# Patient Record
Sex: Female | Born: 1945 | Race: Black or African American | Hispanic: No | Marital: Single | Smoking: Never smoker
Health system: Southern US, Community
[De-identification: ages and names within clinical notes are randomized; demographics above are authoritative.]

## PROBLEM LIST (undated history)

## (undated) DIAGNOSIS — M1990 Unspecified osteoarthritis, unspecified site: Secondary | ICD-10-CM

## (undated) DIAGNOSIS — I2510 Atherosclerotic heart disease of native coronary artery without angina pectoris: Secondary | ICD-10-CM

## (undated) DIAGNOSIS — J45909 Unspecified asthma, uncomplicated: Secondary | ICD-10-CM

## (undated) DIAGNOSIS — N200 Calculus of kidney: Secondary | ICD-10-CM

## (undated) DIAGNOSIS — I10 Essential (primary) hypertension: Secondary | ICD-10-CM

## (undated) DIAGNOSIS — A159 Respiratory tuberculosis unspecified: Secondary | ICD-10-CM

## (undated) DIAGNOSIS — I2699 Other pulmonary embolism without acute cor pulmonale: Secondary | ICD-10-CM

## (undated) DIAGNOSIS — K5792 Diverticulitis of intestine, part unspecified, without perforation or abscess without bleeding: Secondary | ICD-10-CM

## (undated) DIAGNOSIS — E039 Hypothyroidism, unspecified: Secondary | ICD-10-CM

## (undated) DIAGNOSIS — E7800 Pure hypercholesterolemia, unspecified: Secondary | ICD-10-CM

## (undated) DIAGNOSIS — K769 Liver disease, unspecified: Secondary | ICD-10-CM

## (undated) DIAGNOSIS — M810 Age-related osteoporosis without current pathological fracture: Secondary | ICD-10-CM

## (undated) DIAGNOSIS — F419 Anxiety disorder, unspecified: Secondary | ICD-10-CM

## (undated) DIAGNOSIS — E119 Type 2 diabetes mellitus without complications: Secondary | ICD-10-CM

## (undated) DIAGNOSIS — J449 Chronic obstructive pulmonary disease, unspecified: Secondary | ICD-10-CM

## (undated) DIAGNOSIS — M797 Fibromyalgia: Secondary | ICD-10-CM

## (undated) DIAGNOSIS — E0590 Thyrotoxicosis, unspecified without thyrotoxic crisis or storm: Secondary | ICD-10-CM

## (undated) DIAGNOSIS — F32A Depression, unspecified: Secondary | ICD-10-CM

## (undated) DIAGNOSIS — M109 Gout, unspecified: Secondary | ICD-10-CM

## (undated) DIAGNOSIS — I499 Cardiac arrhythmia, unspecified: Secondary | ICD-10-CM

## (undated) DIAGNOSIS — C801 Malignant (primary) neoplasm, unspecified: Secondary | ICD-10-CM

## (undated) DIAGNOSIS — I519 Heart disease, unspecified: Secondary | ICD-10-CM

## (undated) DIAGNOSIS — I639 Cerebral infarction, unspecified: Secondary | ICD-10-CM

## (undated) DIAGNOSIS — K219 Gastro-esophageal reflux disease without esophagitis: Secondary | ICD-10-CM

## (undated) HISTORY — DX: Atherosclerotic heart disease of native coronary artery without angina pectoris: I25.10

## (undated) HISTORY — DX: Fibromyalgia: M79.7

## (undated) HISTORY — DX: Unspecified asthma, uncomplicated: J45.909

## (undated) HISTORY — DX: Chronic obstructive pulmonary disease, unspecified: J44.9

## (undated) HISTORY — DX: Liver disease, unspecified: K76.9

## (undated) HISTORY — DX: Hypothyroidism, unspecified: E03.9

## (undated) HISTORY — DX: Cerebral infarction, unspecified: I63.9

## (undated) HISTORY — DX: Anxiety disorder, unspecified: F41.9

## (undated) HISTORY — DX: Type 2 diabetes mellitus without complications: E11.9

## (undated) HISTORY — DX: Diverticulitis of intestine, part unspecified, without perforation or abscess without bleeding: K57.92

## (undated) HISTORY — PX: DILATION AND CURETTAGE OF UTERUS: SHX78

## (undated) HISTORY — DX: Gout, unspecified: M10.9

## (undated) HISTORY — DX: Pure hypercholesterolemia, unspecified: E78.00

## (undated) HISTORY — DX: Depression, unspecified: F32.A

## (undated) HISTORY — DX: Heart disease, unspecified: I51.9

## (undated) HISTORY — DX: Thyrotoxicosis, unspecified without thyrotoxic crisis or storm: E05.90

## (undated) HISTORY — DX: Unspecified osteoarthritis, unspecified site: M19.90

## (undated) HISTORY — DX: Calculus of kidney: N20.0

## (undated) HISTORY — DX: Respiratory tuberculosis unspecified: A15.9

## (undated) HISTORY — DX: Age-related osteoporosis without current pathological fracture: M81.0

## (undated) HISTORY — DX: Malignant (primary) neoplasm, unspecified: C80.1

## (undated) HISTORY — DX: Essential (primary) hypertension: I10

## (undated) HISTORY — DX: Other pulmonary embolism without acute cor pulmonale: I26.99

## (undated) HISTORY — DX: Gastro-esophageal reflux disease without esophagitis: K21.9

---

## 2012-09-22 ENCOUNTER — Emergency Department (HOSPITAL_COMMUNITY)
Admission: EM | Admit: 2012-09-22 | Discharge: 2012-09-22 | Disposition: A | Discharge status: Home / Self Care | Payer: Medicare Other | Attending: Emergency Medicine | Admitting: Emergency Medicine

## 2012-09-22 ENCOUNTER — Emergency Department (HOSPITAL_COMMUNITY): Payer: Medicare Other

## 2012-09-22 ENCOUNTER — Encounter (HOSPITAL_COMMUNITY): Payer: Self-pay | Admitting: Emergency Medicine

## 2012-09-22 DIAGNOSIS — S43401A Unspecified sprain of right shoulder joint, initial encounter: Secondary | ICD-10-CM

## 2012-09-22 DIAGNOSIS — Z7982 Long term (current) use of aspirin: Secondary | ICD-10-CM | POA: Insufficient documentation

## 2012-09-22 DIAGNOSIS — Y939 Activity, unspecified: Secondary | ICD-10-CM | POA: Insufficient documentation

## 2012-09-22 DIAGNOSIS — W19XXXA Unspecified fall, initial encounter: Secondary | ICD-10-CM | POA: Insufficient documentation

## 2012-09-22 DIAGNOSIS — IMO0002 Reserved for concepts with insufficient information to code with codable children: Secondary | ICD-10-CM | POA: Insufficient documentation

## 2012-09-22 DIAGNOSIS — Y929 Unspecified place or not applicable: Secondary | ICD-10-CM | POA: Insufficient documentation

## 2012-09-22 DIAGNOSIS — Z8679 Personal history of other diseases of the circulatory system: Secondary | ICD-10-CM | POA: Insufficient documentation

## 2012-09-22 HISTORY — DX: Cardiac arrhythmia, unspecified: I49.9

## 2012-09-22 NOTE — ED Provider Notes (Signed)
History  This chart was scribed for non-physician practitioner working with Kerry Sprout, MD by Greggory Stallion, ED scribe. This patient was seen in room TR06C/TR06C and the patient's care was started at 3:03 PM.  CSN: [id number]  Arrival date & time 09/22/12  1332    Chief Complaint  Patient presents with  . Fall    The history is provided by the patient. No language interpreter was used.    HPI Comments: Kerry [name] is a 67 y.o. female who presents to the Emergency Department complaining of a fall that happened 1 week ago. Pt states the impact was on her right shoulder. Pt states she felt okay afterwards but since then she has had trouble moving her right shoulder. She states the pain is worsened when she moves. Pt states her wrist and elbow are okay. Pt denies LOC, fever, neck pain, sore throat, visual disturbance, armpit pain, collarbone pain, CP, cough, SOB, abdominal pain, nausea, emesis, diarrhea, urinary symptoms, back pain, HA, weakness, numbness and rash as associated symptoms. Pt states she has taken aspirin for the pain with some relief. She states she also ices her shoulder with some relief.  ASA relieves her pain almost completely for a short time.  Past Medical History  Diagnosis Date  . Arrhythmia     Past Surgical History  Procedure Laterality Date  . Dilation and curettage of uterus      No family history on file.  History  Substance Use Topics  . Smoking status: Never Smoker   . Smokeless tobacco: Not on file  . Alcohol Use: Yes    OB History   Grav Para Term Preterm Abortions TAB SAB Ect Mult Living                  Review of Systems  A complete 10 system review of systems was obtained and all systems are negative except as noted in the HPI and PMH.   Allergies  Chocolate; Strawberry; and Other  Home Medications   Current Outpatient Rx  Name  Route  Sig  Dispense  Refill  . aspirin 325 MG tablet   Oral   Take 325 mg by mouth daily as  needed for pain.           BP 144/64  Pulse 76  Temp(Src) 98.7 F (37.1 C) (Oral)  Resp 16  SpO2 97%  Physical Exam  Nursing note and vitals reviewed. Constitutional: She is oriented to [name], place, and time. She appears well-developed and well-nourished.  HENT:  Head: Normocephalic and atraumatic.  Eyes: EOM are normal. Pupils are equal, round, and reactive to light.  Neck: No spinous process tenderness and no muscular tenderness present.  Cardiovascular: Normal rate, regular rhythm, normal heart sounds and intact distal pulses.   No murmur heard. Pulmonary/Chest: Effort normal and breath sounds normal. No respiratory distress. She has no wheezes. She has no rales.  Musculoskeletal:  Pain to palpation to proximal humerus. Pain with internal and external palpation and abduction. +2 radial pulse. No wrist, elbow, or neck pain.   Neurological: She is alert and oriented to [name], place, and time.  Skin: Skin is warm and dry.    ED Course  Procedures (including critical care time)  DIAGNOSTIC STUDIES: Oxygen Saturation is 97% on RA, normal by my interpretation.    COORDINATION OF CARE: 3:20 PM-Discussed treatment plan with pt at bedside and pt agreed to plan.   Labs Reviewed - No data to display Dg Shoulder Right  09/22/2012   *RADIOLOGY REPORT*  Clinical Data: Right shoulder pain.  RIGHT SHOULDER - 2+ VIEW  Comparison: None.  Findings: The joint spaces are maintained.  No acute fracture.  The lung apices are clear.  IMPRESSION: No fracture or dislocation.   Original Report Authenticated By: Rudie Meyer, M.D.     1. Sprain shoulder/arm, right, initial encounter       MDM   Patient with a mechanical fall approximately a week and a half ago with all the impact on her right shoulder presents due to persistent right shoulder pain. No weakness and neurovascularly intact with 2+ radial pulses.  Film negative for acute fracture or dislocation. Feel most likely patient has  a rotator cuff injury versus bad shoulder contusion. Gave her followup with orthopedics if pain does not improve in the next week. She states her pain is mostly relieved with aspirin and recommended trying Tylenol as well to prevent GI upset.      I personally performed the services described in this documentation, which was scribed in my presence.  The recorded information has been reviewed and considered.   Kerry Sprout, MD 09/22/12 1549

## 2012-09-22 NOTE — ED Notes (Addendum)
Pt reports fell about 2 weeks ago and continues to have pain in right arm. Pt has not been seen by a Doctor for this. Pt reports increase pain with movement. + Pulses noted, equal grips.

## 2013-07-03 ENCOUNTER — Emergency Department (HOSPITAL_COMMUNITY)
Admission: EM | Admit: 2013-07-03 | Discharge: 2013-07-03 | Disposition: A | Discharge status: Home / Self Care | Payer: Medicare Other | Attending: Emergency Medicine | Admitting: Emergency Medicine

## 2013-07-03 ENCOUNTER — Encounter (HOSPITAL_COMMUNITY): Payer: Self-pay | Admitting: Emergency Medicine

## 2013-07-03 DIAGNOSIS — H81399 Other peripheral vertigo, unspecified ear: Secondary | ICD-10-CM

## 2013-07-03 DIAGNOSIS — Z8679 Personal history of other diseases of the circulatory system: Secondary | ICD-10-CM | POA: Insufficient documentation

## 2013-07-03 LAB — URINALYSIS, ROUTINE W REFLEX MICROSCOPIC
Bilirubin Urine: NEGATIVE
GLUCOSE, UA: NEGATIVE mg/dL
Hgb urine dipstick: NEGATIVE
Ketones, ur: NEGATIVE mg/dL
Nitrite: NEGATIVE
PH: 7 (ref 5.0–8.0)
Protein, ur: NEGATIVE mg/dL
SPECIFIC GRAVITY, URINE: 1.024 (ref 1.005–1.030)
Urobilinogen, UA: 0.2 mg/dL (ref 0.0–1.0)

## 2013-07-03 LAB — COMPREHENSIVE METABOLIC PANEL
ALBUMIN: 3.5 g/dL (ref 3.5–5.2)
ALT: 12 U/L (ref 0–35)
AST: 19 U/L (ref 0–37)
Alkaline Phosphatase: 96 U/L (ref 39–117)
BUN: 18 mg/dL (ref 6–23)
CALCIUM: 9.3 mg/dL (ref 8.4–10.5)
CHLORIDE: 101 meq/L (ref 96–112)
CO2: 26 mEq/L (ref 19–32)
CREATININE: 0.96 mg/dL (ref 0.50–1.10)
GFR calc Af Amer: 70 mL/min — ABNORMAL LOW (ref >90)
GFR calc non Af Amer: 60 mL/min — ABNORMAL LOW (ref >90)
Glucose, Bld: 107 mg/dL — ABNORMAL HIGH (ref 70–99)
Potassium: 4.2 mEq/L (ref 3.7–5.3)
Sodium: 137 mEq/L (ref 137–147)
Total Bilirubin: 0.3 mg/dL (ref 0.3–1.2)
Total Protein: 8.2 g/dL (ref 6.0–8.3)

## 2013-07-03 LAB — CBC WITH DIFFERENTIAL/PLATELET
BASOS ABS: 0 10*3/uL (ref 0.0–0.1)
BASOS PCT: 1 % (ref 0–1)
EOS PCT: 2 % (ref 0–5)
Eosinophils Absolute: 0.1 10*3/uL (ref 0.0–0.7)
HEMATOCRIT: 40.1 % (ref 36.0–46.0)
Hemoglobin: 13 g/dL (ref 12.0–15.0)
Lymphocytes Relative: 22 % (ref 12–46)
Lymphs Abs: 1 10*3/uL (ref 0.7–4.0)
MCH: 27.3 pg (ref 26.0–34.0)
MCHC: 32.4 g/dL (ref 30.0–36.0)
MCV: 84.1 fL (ref 78.0–100.0)
MONO ABS: 0.3 10*3/uL (ref 0.1–1.0)
Monocytes Relative: 6 % (ref 3–12)
NEUTROS ABS: 3 10*3/uL (ref 1.7–7.7)
Neutrophils Relative %: 69 % (ref 43–77)
Platelets: 224 10*3/uL (ref 150–400)
RBC: 4.77 MIL/uL (ref 3.87–5.11)
RDW: 14.1 % (ref 11.5–15.5)
WBC: 4.4 10*3/uL (ref 4.0–10.5)

## 2013-07-03 LAB — URINE MICROSCOPIC-ADD ON

## 2013-07-03 LAB — CBG MONITORING, ED: GLUCOSE-CAPILLARY: 99 mg/dL (ref 70–99)

## 2013-07-03 LAB — LIPASE, BLOOD: Lipase: 60 U/L — ABNORMAL HIGH (ref 11–59)

## 2013-07-03 MED ORDER — ONDANSETRON 4 MG PO TBDP
4.0000 mg | ORAL_TABLET | Freq: Once | ORAL | Status: AC
  Administered 2013-07-03: 4 mg via ORAL
  Filled 2013-07-03: qty 1

## 2013-07-03 MED ORDER — MECLIZINE HCL 50 MG PO TABS: 50.0000 mg | ORAL_TABLET | Freq: Three times a day (TID) | ORAL | Status: DC | PRN

## 2013-07-03 MED ORDER — ONDANSETRON HCL 4 MG PO TABS: 4.0000 mg | ORAL_TABLET | Freq: Three times a day (TID) | ORAL | Status: DC | PRN

## 2013-07-03 MED ORDER — MECLIZINE HCL 25 MG PO TABS
25.0000 mg | ORAL_TABLET | Freq: Once | ORAL | Status: AC
  Administered 2013-07-03: 25 mg via ORAL
  Filled 2013-07-03: qty 1

## 2013-07-03 NOTE — ED Provider Notes (Addendum)
CSN: [id number]     Arrival date & time 07/03/13  0902 History   First MD Initiated Contact with Patient 07/03/13 1112     Chief Complaint  Patient presents with  . Emesis     (Consider location/radiation/quality/duration/timing/severity/associated sxs/prior Treatment) Patient is a 68 y.o. female presenting with dizziness. The history is provided by the patient.  Dizziness Quality:  Imbalance and vertigo Severity:  Moderate Onset quality:  Gradual Duration:  24 hours Timing:  Constant Progression:  Unchanged Chronicity:  New Context comment:  Started while eating breakfast yesterday morning Relieved by:  Being still Worsened by:  Movement (walking) Ineffective treatments:  Lying down Associated symptoms: nausea and vomiting   Associated symptoms: no chest pain, no diarrhea, no headaches, no palpitations, no shortness of breath, no syncope, no vision changes and no weakness   Risk factors: no anemia, no hx of stroke, no hx of vertigo and no new medications     Past Medical History  Diagnosis Date  . Arrhythmia    Past Surgical History  Procedure Laterality Date  . Dilation and curettage of uterus     History reviewed. No pertinent family history. History  Substance Use Topics  . Smoking status: Never Smoker   . Smokeless tobacco: Not on file  . Alcohol Use: 4.2 oz/week    7 Cans of beer per week   OB History   Grav Para Term Preterm Abortions TAB SAB Ect Mult Living                 Review of Systems  Respiratory: Negative for shortness of breath.   Cardiovascular: Negative for chest pain, palpitations and syncope.  Gastrointestinal: Positive for nausea and vomiting. Negative for diarrhea.  Neurological: Positive for dizziness. Negative for headaches.  All other systems reviewed and are negative.      Allergies  Chocolate; Strawberry; and Other  Home Medications   Current Outpatient Rx  Name  Route  Sig  Dispense  Refill  . aspirin 325 MG tablet    Oral   Take 325 mg by mouth daily as needed for pain.          BP 117/61  Pulse 74  Temp(Src) 98.2 F (36.8 C) (Oral)  Resp 12  SpO2 98% Physical Exam  Nursing note and vitals reviewed. Constitutional: She is oriented to person, place, and time. She appears well-developed and well-nourished. No distress.  HENT:  Head: Normocephalic and atraumatic.  Right Ear: Tympanic membrane and ear canal normal.  Left Ear: Tympanic membrane and ear canal normal.  Mouth/Throat: Oropharynx is clear and moist.  Eyes: Conjunctivae and EOM are normal. Pupils are equal, round, and reactive to light.  No nystagmus  Neck: Normal range of motion. Neck supple.  Cardiovascular: Normal rate, regular rhythm and intact distal pulses.   No murmur heard. Pulmonary/Chest: Effort normal and breath sounds normal. No respiratory distress. She has no wheezes. She has no rales.  Abdominal: Soft. She exhibits no distension. There is no tenderness. There is no rebound and no guarding.  Musculoskeletal: Normal range of motion. She exhibits no edema and no tenderness.  Neurological: She is alert and oriented to person, place, and time. She has normal strength. No cranial nerve deficit or sensory deficit. Coordination normal.  Normal finger to nose and heel to shin testing.  No rhomberg.  Skin: Skin is warm and dry. No rash noted. No erythema.  Psychiatric: She has a normal mood and affect. Her behavior is normal.  ED Course  Procedures (including critical care time) Labs Review Labs Reviewed  COMPREHENSIVE METABOLIC PANEL - Abnormal; Notable for the following:    Glucose, Bld 107 (*)    GFR calc non Af Amer 60 (*)    GFR calc Af Amer 70 (*)    All other components within normal limits  LIPASE, BLOOD - Abnormal; Notable for the following:    Lipase 60 (*)    All other components within normal limits  URINALYSIS, ROUTINE W REFLEX MICROSCOPIC - Abnormal; Notable for the following:    APPearance CLOUDY (*)     Leukocytes, UA SMALL (*)    All other components within normal limits  URINE MICROSCOPIC-ADD ON - Abnormal; Notable for the following:    Squamous Epithelial / LPF MANY (*)    Bacteria, UA FEW (*)    All other components within normal limits  CBC WITH DIFFERENTIAL  CBG MONITORING, ED   Imaging Review No results found.   EKG Interpretation   Date/Time:  Friday July 03 2013 12:20:10 EST Ventricular Rate:  69 PR Interval:  142 QRS Duration: 78 QT Interval:  384 QTC Calculation: 411 R Axis:   35 Text Interpretation:  Sinus rhythm Low voltage, precordial leads Abnormal  R-wave progression, early transition No previous tracing Confirmed by  Anitra Lauth  MD, Alphonzo Lemmings ([id number]) on 07/03/2013 12:50:54 PM      MDM   Final diagnoses:  Peripheral vertigo    Pt with sx most consistent with peripheral vertigo.  No systemic or infectious sx.  Normal neuro exam without weakness, ataxia or cerebellar findings on exam.  Normal vision.  Sx are reproducible with movement of the head and attempting to walk.  No hx of Stroke and low likelihood.  No risk factors and normal VS. Will treat for peripheral vertigo and re-eval.  2:33 PM Pt feeling better and able to tolerate po's. Labs wnl which were sent from triage. Pt ambulated in the hall without signs of ataxia.  Will treat with meclizine and zofran and have return with any problems.  Gwyneth Sprout, MD 07/03/13 1433  Gwyneth Sprout, MD 07/03/13 1436

## 2013-07-03 NOTE — ED Notes (Addendum)
C/o nausea and vomiting since yesterday. States she has felt "very dizzy" also. She states she recently started to eat lots of fermented foods "to try to change the flora in my stomach." states she is unsure if the dietary change has upset her stomach. Denies pain. States her BMs have been "a funny color yellow" but denies any other bowel or bladder changes

## 2013-07-03 NOTE — Discharge Instructions (Signed)

## 2014-09-27 IMAGING — CR DG SHOULDER 2+V*R*
3 series · 3 of 3 positions shown · non-contrast
Comparison: None.

CLINICAL DATA: Right shoulder pain.

RIGHT SHOULDER - 2+ VIEW

[w shoulder external right]
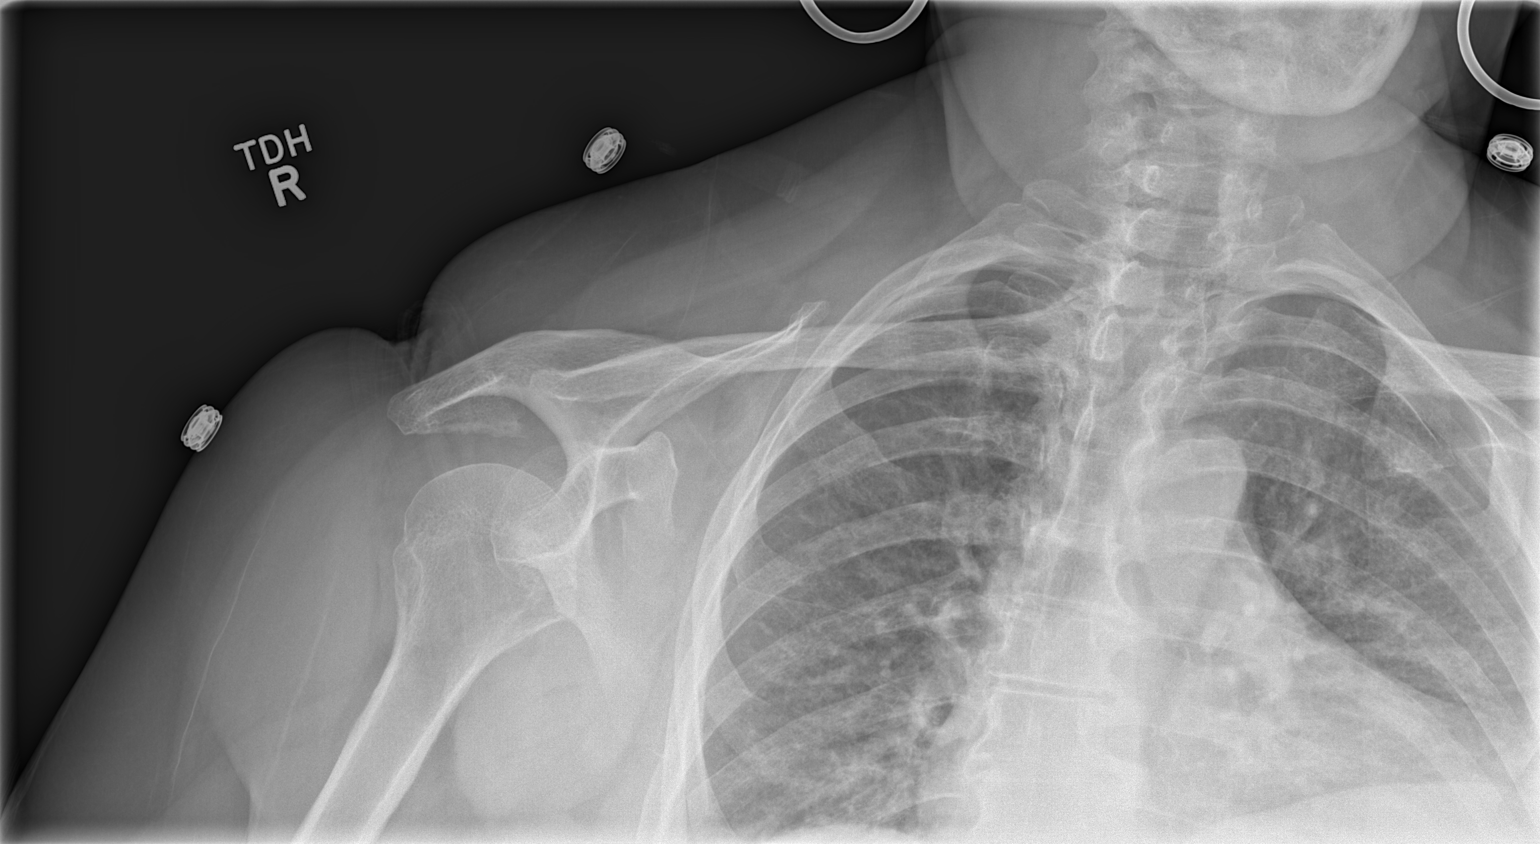

[w shoulder y-view right]
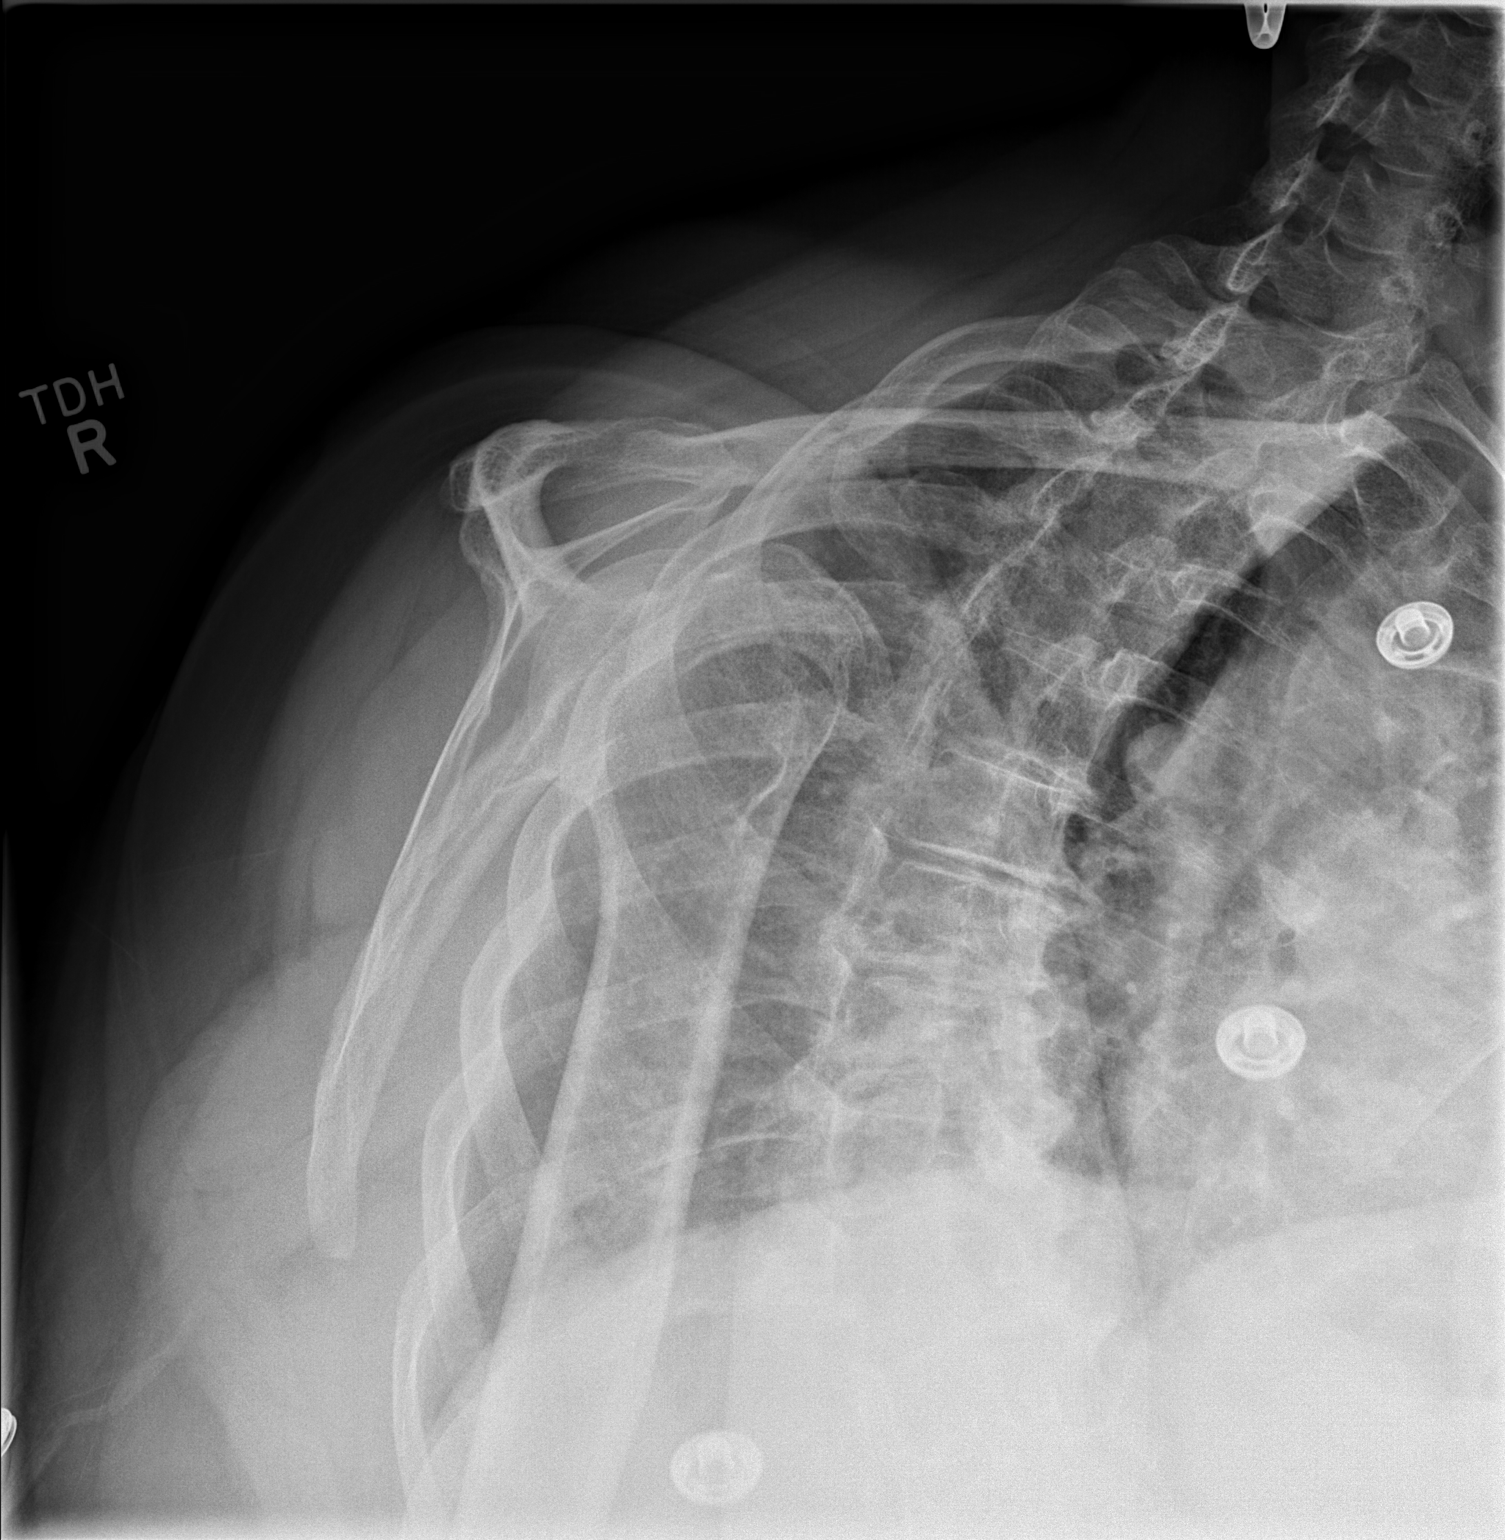

[x shoulder axillary right]
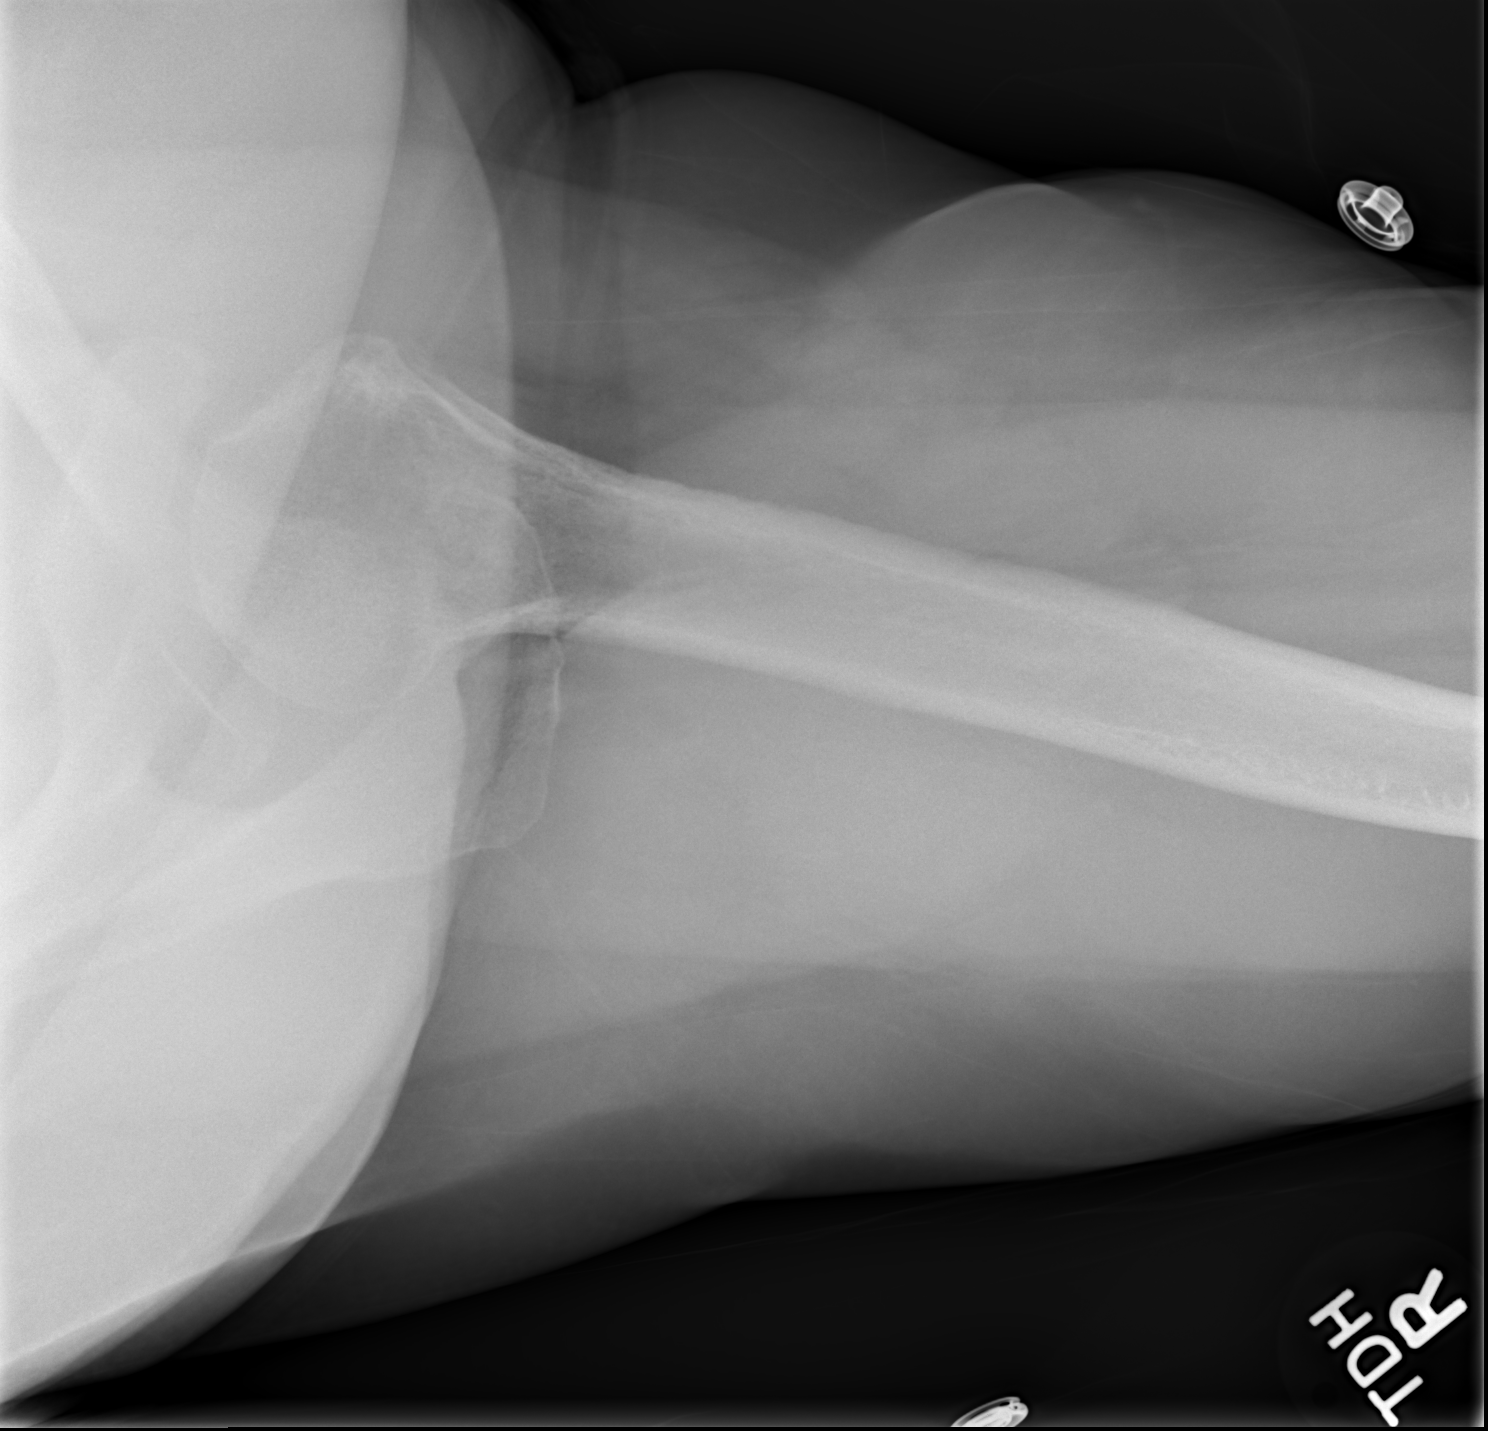

[3 of 3 positions shown; findings below may reference images not displayed]

FINDINGS: The joint spaces are maintained.  No acute fracture.  The
lung apices are clear.
IMPRESSION: No fracture or dislocation.

## 2014-12-31 ENCOUNTER — Encounter (HOSPITAL_COMMUNITY): Payer: Self-pay | Admitting: *Deleted

## 2014-12-31 ENCOUNTER — Emergency Department (INDEPENDENT_AMBULATORY_CARE_PROVIDER_SITE_OTHER)
Admission: EM | Admit: 2014-12-31 | Discharge: 2014-12-31 | Disposition: A | Discharge status: Home / Self Care | Payer: Medicare Other | Source: Home / Self Care | Attending: Family Medicine | Admitting: Family Medicine

## 2014-12-31 DIAGNOSIS — H6121 Impacted cerumen, right ear: Secondary | ICD-10-CM | POA: Diagnosis not present

## 2014-12-31 MED ORDER — NEOMYCIN-POLYMYXIN-HC 3.5-10000-1 OT SUSP
OTIC | Status: AC
  Filled 2014-12-31: qty 10

## 2014-12-31 MED ORDER — NEOMYCIN-POLYMYXIN-HC 1 % OT SOLN
4.0000 [drp] | Freq: Two times a day (BID) | OTIC | Status: DC
  Administered 2014-12-31: 4 [drp] via OTIC

## 2014-12-31 NOTE — ED Provider Notes (Addendum)
CSN: [id number]     Arrival date & time 12/31/14  1602 History   First MD Initiated Contact with Patient 12/31/14 1650     Chief Complaint  Patient presents with  . Otalgia   (Consider location/radiation/quality/duration/timing/severity/associated sxs/prior Treatment) Patient is a 69 y.o. female presenting with ear pain. The history is provided by the patient.  Otalgia Location:  Right Behind ear:  No abnormality Quality:  Dull and pressure Severity:  Mild Onset quality:  Gradual Duration:  5 months Progression:  Unchanged Chronicity:  Chronic Context: no water in ear   Relieved by:  None tried Worsened by:  Nothing tried Ineffective treatments:  None tried Associated symptoms: hearing loss   Associated symptoms: no congestion, no ear discharge and no fever   Risk factors: no chronic ear infection and no prior ear surgery     Past Medical History  Diagnosis Date  . Arrhythmia    Past Surgical History  Procedure Laterality Date  . Dilation and curettage of uterus     History reviewed. No pertinent family history. Social History  Substance Use Topics  . Smoking status: Never Smoker   . Smokeless tobacco: None  . Alcohol Use: 4.2 oz/week    7 Cans of beer per week   OB History    No data available     Review of Systems  Constitutional: Negative.  Negative for fever.  HENT: Positive for ear pain and hearing loss. Negative for congestion, ear discharge, facial swelling and postnasal drip.   All other systems reviewed and are negative.   Allergies  Chocolate; Strawberry; and Other  Home Medications   Prior to Admission medications   Medication Sig Start Date End Date Taking? Authorizing Provider  aspirin 325 MG tablet Take 325 mg by mouth daily as needed for pain.    Historical Provider, MD  meclizine (ANTIVERT) 50 MG tablet Take 1 tablet (50 mg total) by mouth 3 (three) times daily as needed for dizziness or nausea. 07/03/13   Gwyneth Sprout, MD  ondansetron  (ZOFRAN) 4 MG tablet Take 1 tablet (4 mg total) by mouth every 8 (eight) hours as needed for nausea or vomiting. 07/03/13   Gwyneth Sprout, MD   Meds Ordered and Administered this Visit   Medications  NEOMYCIN-POLYMYXIN-HYDROCORTISONE (CORTISPORIN) otic solution 4 drop (not administered)    BP 149/76 mmHg  Pulse 83  Temp(Src) 98.1 F (36.7 C) (Oral)  Resp 16  SpO2 97% No data found.   Physical Exam  Constitutional: She is oriented to person, place, and time. She appears well-developed and well-nourished.  HENT:  Right Ear: No drainage or tenderness. Tympanic membrane is not perforated. Decreased hearing is noted.  Left Ear: Tympanic membrane, external ear and ear canal normal.  Ears:  Eyes: Pupils are equal, round, and reactive to light.  Neck: Normal range of motion. Neck supple.  Lymphadenopathy:    She has no cervical adenopathy.  Neurological: She is alert and oriented to person, place, and time.  Skin: Skin is warm and dry.  Nursing note and vitals reviewed.   ED Course  Procedures (including critical care time)  Labs Review Labs Reviewed - No data to display  Imaging Review No results found.   Visual Acuity Review  Right Eye Distance:   Left Eye Distance:   Bilateral Distance:    Right Eye Near:   Left Eye Near:    Bilateral Near:         MDM   1. Cerumen impaction,  right     Ear irrigated and sx improved.    Linna Hoff, MD 12/31/14 1658  Linna Hoff, MD 12/31/14 [phone]

## 2014-12-31 NOTE — ED Notes (Signed)
Pt  Has  Clogged  r  Ear  For  Several  Months             She is  Here  To have  It   Cleaned          She    Is  Sitting  Upright on  The  Exam table  Appearing in no  Acute  Distress

## 2015-11-11 ENCOUNTER — Ambulatory Visit (HOSPITAL_COMMUNITY)
Admission: EM | Admit: 2015-11-11 | Discharge: 2015-11-11 | Disposition: A | Discharge status: Home / Self Care | Payer: Medicare Other | Attending: Emergency Medicine | Admitting: Emergency Medicine

## 2015-11-11 ENCOUNTER — Encounter (HOSPITAL_COMMUNITY): Payer: Self-pay | Admitting: Family Medicine

## 2015-11-11 DIAGNOSIS — S90829A Blister (nonthermal), unspecified foot, initial encounter: Secondary | ICD-10-CM

## 2015-11-11 DIAGNOSIS — R21 Rash and other nonspecific skin eruption: Secondary | ICD-10-CM | POA: Diagnosis not present

## 2015-11-11 MED ORDER — TRIAMCINOLONE ACETONIDE 0.1 % EX CREA: 1.0000 "application " | TOPICAL_CREAM | Freq: Two times a day (BID) | CUTANEOUS | Status: DC

## 2015-11-11 NOTE — Discharge Instructions (Signed)
Blisters °A blister is a fluid-filled sac that forms between layers of skin. Blisters often form in areas where skin rubs against other skin or rubs against something else. The most common areas for blisters are the hands and feet. °CAUSES °A blister can be caused by: °· An injury. °· A burn. °· An allergic reaction. °· An infection. °· Exposure to irritating chemicals. °· Friction. °Friction blisters often result from: °· Sports. °· Repetitive activities. °· Shoes that are too tight or too loose. °SIGNS AND SYMPTOMS °A blister is often round and looks like a bump. It may itch or be painful to the touch. The liquid in a blister is clear or bloody. Before a blister forms, the skin may become red, feel warm, itch, or be painful to the touch. °DIAGNOSIS °A blister can usually be diagnosed from its appearance. °TREATMENT °Treatment involves protecting the area where the blister has formed until the skin has healed. If something is likely to rub against the blister, apply a bandage (dressing) with a hole in the middle over the blister. °Most blisters break open, dry up, and go away on their own within 10 days. Rarely, blisters that are very painful may be drained before they break open on their own. Draining of a blister should only be done by a health care provider under sterile conditions. °HOME CARE INSTRUCTIONS °· Protect the area where the blister has formed as directed by your health care provider. °· Do not open or pop your blister, because it could become infected. °· If the blister is very painful, ask your health care provider whether you should have it drained. °· If the blister breaks open on its own: °¨ Do not remove the loose skin that is over the blister. °¨ Wash the blister area with soap and water every day. °¨ After washing the blister area, you may apply an antibiotic cream or ointment and cover the area with a bandage. °PREVENTION °Taking these steps can help to prevent blisters that are caused by  friction: °· Wear comfortable shoes that fit well. °· Always wear socks with shoes. °· Wear extra socks or use tape, bandages, or pads over blister-prone areas as needed. °· Wear protective gear, such as gloves, when participating in sports or activities that can cause blisters. °· Use powders as needed to keep your feet dry. °SEEK MEDICAL CARE IF: °· You have increased redness, swelling, or pain in the blister area. °· A puslike discharge is coming from the blister area. °· You have a fever. °· You have chills. °  °This information is not intended to replace advice given to you by your health care provider. Make sure you discuss any questions you have with your health care provider. °  °Document Released: 05/24/2004 Document Revised: 05/07/2014 Document Reviewed: 11/14/2013 °Elsevier Interactive Patient Education ©2016 [employer] ° °

## 2015-11-11 NOTE — ED Provider Notes (Signed)
CSN: [id number]     Arrival date & time 11/11/15  1659 History   First MD Initiated Contact with Patient 11/11/15 1821     Chief Complaint  Patient presents with  . Rash   (Consider location/radiation/quality/duration/timing/severity/associated sxs/prior Treatment) HPI Comments: 70 year old female complaining of an itchy rash to her feet for little over a month. She states seasonal she gets it every year. She does not know what the etiology is. Denies systemic symptoms. Deny rash in other areas.  Patient is a 70 y.o. female presenting with rash.  Rash   Past Medical History  Diagnosis Date  . Arrhythmia    Past Surgical History  Procedure Laterality Date  . Dilation and curettage of uterus     History reviewed. No pertinent family history. Social History  Substance Use Topics  . Smoking status: Never Smoker   . Smokeless tobacco: None  . Alcohol Use: 4.2 oz/week    7 Cans of beer per week   OB History    No data available     Review of Systems  Constitutional: Negative.   HENT: Negative.   Respiratory: Negative.   Skin: Positive for rash.  Neurological: Negative.     Allergies  Chocolate; Strawberry extract; and Other  Home Medications   Prior to Admission medications   Medication Sig Start Date End Date Taking? Authorizing Provider  aspirin 325 MG tablet Take 325 mg by mouth daily as needed for pain.    Historical Provider, MD  meclizine (ANTIVERT) 50 MG tablet Take 1 tablet (50 mg total) by mouth 3 (three) times daily as needed for dizziness or nausea. 07/03/13   Gwyneth Sprout, MD  ondansetron (ZOFRAN) 4 MG tablet Take 1 tablet (4 mg total) by mouth every 8 (eight) hours as needed for nausea or vomiting. 07/03/13   Gwyneth Sprout, MD  triamcinolone cream (KENALOG) 0.1 % Apply 1 application topically 2 (two) times daily. 11/11/15   Hayden Rasmussen, NP   Meds Ordered and Administered this Visit  Medications - No data to display  BP 149/81 mmHg  Pulse 78   Temp(Src) 98.4 F (36.9 C) (Oral)  Resp 18  SpO2 98% No data found.   Physical Exam  Constitutional: She is oriented to person, place, and time. She appears well-developed and well-nourished. No distress.  Eyes: EOM are normal.  Neck: Normal range of motion. Neck supple.  Cardiovascular: Normal rate.   Pulmonary/Chest: Effort normal. No respiratory distress.  Musculoskeletal: She exhibits no edema.  Neurological: She is alert and oriented to person, place, and time. She exhibits normal muscle tone.  Skin: Skin is warm and dry.  There are various small papules and vesicles approximately 1-2 mm in diameter to the dorsum of the feet and ankles. A few satellite lesions occur just above the ankles. No drainage. No bleeding, no erythema, no swelling and no evidence of cellulitis.  Psychiatric: She has a normal mood and affect.  Nursing note and vitals reviewed.   ED Course  Procedures (including critical care time)  Labs Review Labs Reviewed - No data to display  Imaging Review No results found.   Visual Acuity Review  Right Eye Distance:   Left Eye Distance:   Bilateral Distance:    Right Eye Near:   Left Eye Near:    Bilateral Near:         MDM   1. Rash   2. Blister of foot without infection, unspecified laterality, initial encounter    Meds ordered this  encounter  Medications  . triamcinolone cream (KENALOG) 0.1 %    Sig: Apply 1 application topically 2 (two) times daily.    Dispense:  30 g    Refill:  0    Order Specific Question:  Supervising Provider    Answer:  Charm RingsHONIG, ERIN J [id number]]   Etiology of rash uncertain. Appears to be an allergic type reaction to an unknown stimulus. The initial observation is reminiscent of insect bites. Possibly contact dermatitis. No signs of infection. We will treat with the above medication.     Hayden Rasmussenavid Maurya Nethery, NP 11/11/15 [phone]

## 2015-11-11 NOTE — ED Notes (Signed)
Pt here for rash to both feet. sts started last month. Pt has a few small blister areas.

## 2020-06-22 DIAGNOSIS — Z0000 Encounter for general adult medical examination without abnormal findings: Secondary | ICD-10-CM | POA: Diagnosis not present

## 2020-12-06 ENCOUNTER — Ambulatory Visit: Payer: Medicare Other | Admitting: Physical Therapy

## 2021-06-21 DIAGNOSIS — Z1159 Encounter for screening for other viral diseases: Secondary | ICD-10-CM | POA: Diagnosis not present

## 2021-06-21 DIAGNOSIS — N8110 Cystocele, unspecified: Secondary | ICD-10-CM | POA: Diagnosis not present

## 2021-06-21 DIAGNOSIS — Z0000 Encounter for general adult medical examination without abnormal findings: Secondary | ICD-10-CM | POA: Diagnosis not present

## 2021-06-21 DIAGNOSIS — Z6835 Body mass index (BMI) 35.0-35.9, adult: Secondary | ICD-10-CM | POA: Diagnosis not present

## 2021-06-21 DIAGNOSIS — E785 Hyperlipidemia, unspecified: Secondary | ICD-10-CM | POA: Diagnosis not present

## 2021-06-21 DIAGNOSIS — I499 Cardiac arrhythmia, unspecified: Secondary | ICD-10-CM | POA: Diagnosis not present

## 2021-06-21 DIAGNOSIS — Z79899 Other long term (current) drug therapy: Secondary | ICD-10-CM | POA: Diagnosis not present

## 2021-06-21 DIAGNOSIS — E669 Obesity, unspecified: Secondary | ICD-10-CM | POA: Diagnosis not present

## 2021-07-21 DIAGNOSIS — I499 Cardiac arrhythmia, unspecified: Secondary | ICD-10-CM | POA: Diagnosis not present

## 2021-07-21 DIAGNOSIS — I8393 Asymptomatic varicose veins of bilateral lower extremities: Secondary | ICD-10-CM | POA: Diagnosis not present

## 2021-07-21 DIAGNOSIS — Z0001 Encounter for general adult medical examination with abnormal findings: Secondary | ICD-10-CM | POA: Diagnosis not present

## 2021-07-21 DIAGNOSIS — Z7189 Other specified counseling: Secondary | ICD-10-CM | POA: Diagnosis not present

## 2021-07-21 DIAGNOSIS — Z6835 Body mass index (BMI) 35.0-35.9, adult: Secondary | ICD-10-CM | POA: Diagnosis not present

## 2021-07-21 DIAGNOSIS — I70202 Unspecified atherosclerosis of native arteries of extremities, left leg: Secondary | ICD-10-CM | POA: Diagnosis not present

## 2021-07-21 DIAGNOSIS — Z0000 Encounter for general adult medical examination without abnormal findings: Secondary | ICD-10-CM | POA: Diagnosis not present

## 2021-07-21 DIAGNOSIS — E785 Hyperlipidemia, unspecified: Secondary | ICD-10-CM | POA: Diagnosis not present

## 2021-07-21 DIAGNOSIS — N8110 Cystocele, unspecified: Secondary | ICD-10-CM | POA: Diagnosis not present

## 2022-11-27 NOTE — Progress Notes (Deleted)
  Cardiology Office Note:  .    Date:  11/27/2022  ID:  Kerry [name], DOB December 06, 1945, MRN [id number] PCP: Patient, No Pcp Per  [facility] Providers Cardiologist:  None { Click to update primary MD,subspecialty MD or APP then REFRESH:1}    CC: Request for Pacemaker Consulted for the evaluation of palpitations at the behest of Ms. Stowe   History of Present Illness: .    Kerry [name] is a 77 y.o. female Morbid obesity, HLD, and palpitations.  Patient notes that she is feeling ***.   Was last feeling well ***. Able to ***  Has had no chest pain, chest pressure, chest tightness, chest stinging ***.  Discomfort occurs with ***, worsens with ***, and improves with ***.    Patient exertion notable for *** with *** and feels no symptoms.    No shortness of breath, DOE ***.  No PND or orthopnea***.  No weight gain***, leg swelling ***, or abdominal swelling***.  No syncope or near syncope ***. Notes *** no palpitations or funny heart beats.     Patient reports prior cardiac testing including ***  No history of ***pre-eclampsia, gestation HTN or gestational DM.  No Fen-Phen or drug use***.  Ambulatory BP ***.     Relevant histories: .  Social *** ROS: As per HPI.   Studies Reviewed: .       *** Risk Assessment/Calculations:    {Does this patient have ATRIAL FIBRILLATION?:[phone]}       Physical Exam:    VS:  There were no vitals taken for this visit.   Wt Readings from Last 3 Encounters:  No data found for Wt    Gen: *** distress, *** obese/well nourished/malnourished   Neck: No JVD, *** carotid bruit Ears: *** Frank Sign Cardiac: No Rubs or Gallops, *** Murmur, ***cardia, *** radial pulses Respiratory: Clear to auscultation bilaterally, *** effort, ***  respiratory rate GI: Soft, nontender, non-distended *** MS: No *** edema; *** moves all extremities Integument: Skin feels *** Neuro:  At time of evaluation, alert and oriented to  person/place/time/situation *** Psych: Normal affect, patient feels ***   ASSESSMENT AND PLAN: .    ***   Riley Lam, MD FASE [facility] Cardiologist [facility]  [address] [place], [address] [place], [place] [address] [phone]  4:07 PM

## 2022-11-28 ENCOUNTER — Encounter: Payer: Self-pay | Admitting: Internal Medicine

## 2022-11-28 ENCOUNTER — Ambulatory Visit: Payer: 59 | Attending: Internal Medicine | Admitting: Internal Medicine

## 2023-01-29 ENCOUNTER — Ambulatory Visit: Payer: 59 | Admitting: Cardiology

## 2023-02-12 ENCOUNTER — Ambulatory Visit (HOSPITAL_COMMUNITY)
Admission: EM | Admit: 2023-02-12 | Discharge: 2023-02-12 | Disposition: A | Discharge status: Home / Self Care | Payer: 59 | Attending: Family Medicine | Admitting: Family Medicine

## 2023-02-12 ENCOUNTER — Emergency Department (HOSPITAL_COMMUNITY)
Admission: EM | Admit: 2023-02-12 | Discharge: 2023-02-12 | Disposition: A | Discharge status: Home / Self Care | Payer: 59

## 2023-02-12 ENCOUNTER — Emergency Department (HOSPITAL_COMMUNITY): Payer: 59

## 2023-02-12 ENCOUNTER — Encounter (HOSPITAL_COMMUNITY): Payer: Self-pay

## 2023-02-12 ENCOUNTER — Encounter (HOSPITAL_COMMUNITY): Payer: Self-pay | Admitting: Emergency Medicine

## 2023-02-12 ENCOUNTER — Other Ambulatory Visit: Payer: Self-pay

## 2023-02-12 DIAGNOSIS — Z79899 Other long term (current) drug therapy: Secondary | ICD-10-CM | POA: Insufficient documentation

## 2023-02-12 DIAGNOSIS — R109 Unspecified abdominal pain: Secondary | ICD-10-CM | POA: Diagnosis not present

## 2023-02-12 DIAGNOSIS — R112 Nausea with vomiting, unspecified: Secondary | ICD-10-CM

## 2023-02-12 DIAGNOSIS — R1031 Right lower quadrant pain: Secondary | ICD-10-CM | POA: Diagnosis not present

## 2023-02-12 DIAGNOSIS — I10 Essential (primary) hypertension: Secondary | ICD-10-CM

## 2023-02-12 DIAGNOSIS — E039 Hypothyroidism, unspecified: Secondary | ICD-10-CM | POA: Diagnosis not present

## 2023-02-12 DIAGNOSIS — R319 Hematuria, unspecified: Secondary | ICD-10-CM

## 2023-02-12 DIAGNOSIS — I2510 Atherosclerotic heart disease of native coronary artery without angina pectoris: Secondary | ICD-10-CM | POA: Diagnosis not present

## 2023-02-12 DIAGNOSIS — J449 Chronic obstructive pulmonary disease, unspecified: Secondary | ICD-10-CM | POA: Diagnosis not present

## 2023-02-12 DIAGNOSIS — Z7982 Long term (current) use of aspirin: Secondary | ICD-10-CM | POA: Insufficient documentation

## 2023-02-12 LAB — CBC WITH DIFFERENTIAL/PLATELET
Abs Immature Granulocytes: 0.02 10*3/uL (ref 0.00–0.07)
Basophils Absolute: 0 10*3/uL (ref 0.0–0.1)
Basophils Relative: 0 %
Eosinophils Absolute: 0 10*3/uL (ref 0.0–0.5)
Eosinophils Relative: 0 %
HCT: 46.4 % — ABNORMAL HIGH (ref 36.0–46.0)
Hemoglobin: 14.9 g/dL (ref 12.0–15.0)
Immature Granulocytes: 0 %
Lymphocytes Relative: 11 %
Lymphs Abs: 0.6 10*3/uL — ABNORMAL LOW (ref 0.7–4.0)
MCH: 27.6 pg (ref 26.0–34.0)
MCHC: 32.1 g/dL (ref 30.0–36.0)
MCV: 86.1 fL (ref 80.0–100.0)
Monocytes Absolute: 0.2 10*3/uL (ref 0.1–1.0)
Monocytes Relative: 4 %
Neutro Abs: 4.3 10*3/uL (ref 1.7–7.7)
Neutrophils Relative %: 85 %
Platelets: 224 10*3/uL (ref 150–400)
RBC: 5.39 MIL/uL — ABNORMAL HIGH (ref 3.87–5.11)
RDW: 13.9 % (ref 11.5–15.5)
WBC: 5.1 10*3/uL (ref 4.0–10.5)
nRBC: 0 % (ref 0.0–0.2)

## 2023-02-12 LAB — COMPREHENSIVE METABOLIC PANEL
ALT: 18 U/L (ref 0–44)
AST: 27 U/L (ref 15–41)
Albumin: 3.7 g/dL (ref 3.5–5.0)
Alkaline Phosphatase: 85 U/L (ref 38–126)
Anion gap: 8 (ref 5–15)
BUN: 8 mg/dL (ref 8–23)
CO2: 25 mmol/L (ref 22–32)
Calcium: 9 mg/dL (ref 8.9–10.3)
Chloride: 100 mmol/L (ref 98–111)
Creatinine, Ser: 0.87 mg/dL (ref 0.44–1.00)
GFR, Estimated: >60 mL/min (ref >60)
Glucose, Bld: 112 mg/dL — ABNORMAL HIGH (ref 70–99)
Potassium: 3.8 mmol/L (ref 3.5–5.1)
Sodium: 133 mmol/L — ABNORMAL LOW (ref 135–145)
Total Bilirubin: 0.7 mg/dL (ref 0.3–1.2)
Total Protein: 8.1 g/dL (ref 6.5–8.1)

## 2023-02-12 LAB — URINALYSIS, ROUTINE W REFLEX MICROSCOPIC
Bilirubin Urine: NEGATIVE
Glucose, UA: NEGATIVE mg/dL
Ketones, ur: 5 mg/dL — AB
Leukocytes,Ua: NEGATIVE
Nitrite: NEGATIVE
Protein, ur: >=300 mg/dL — AB
Specific Gravity, Urine: 1.018 (ref 1.005–1.030)
pH: 6 (ref 5.0–8.0)

## 2023-02-12 LAB — POCT URINALYSIS DIP (MANUAL ENTRY)
Bilirubin, UA: NEGATIVE
Glucose, UA: NEGATIVE mg/dL
Ketones, POC UA: NEGATIVE mg/dL
Leukocytes, UA: NEGATIVE
Nitrite, UA: NEGATIVE
Protein Ur, POC: >=300 mg/dL — AB
Spec Grav, UA: >=1.03 — AB (ref 1.010–1.025)
Urobilinogen, UA: 0.2 U/dL
pH, UA: 7 (ref 5.0–8.0)

## 2023-02-12 LAB — LIPASE, BLOOD: Lipase: 31 U/L (ref 11–51)

## 2023-02-12 LAB — POCT FASTING CBG KUC MANUAL ENTRY: POCT Glucose (KUC): 115 mg/dL — AB (ref 70–99)

## 2023-02-12 MED ORDER — IOHEXOL 350 MG/ML SOLN
75.0000 mL | Freq: Once | INTRAVENOUS | Status: AC | PRN
  Administered 2023-02-12: 75 mL via INTRAVENOUS

## 2023-02-12 MED ORDER — MORPHINE SULFATE (PF) 4 MG/ML IV SOLN
4.0000 mg | Freq: Once | INTRAVENOUS | Status: AC
  Administered 2023-02-12: 4 mg via INTRAVENOUS
  Filled 2023-02-12: qty 1

## 2023-02-12 MED ORDER — ONDANSETRON HCL 4 MG/2ML IJ SOLN
4.0000 mg | Freq: Once | INTRAMUSCULAR | Status: AC
  Administered 2023-02-12: 4 mg via INTRAVENOUS
  Filled 2023-02-12: qty 2

## 2023-02-12 MED ORDER — ONDANSETRON 4 MG PO TBDP
ORAL_TABLET | ORAL | Status: AC
  Filled 2023-02-12: qty 1

## 2023-02-12 MED ORDER — ONDANSETRON 4 MG PO TBDP
4.0000 mg | ORAL_TABLET | Freq: Once | ORAL | Status: AC
  Administered 2023-02-12: 4 mg via ORAL

## 2023-02-12 MED ORDER — HYDROMORPHONE HCL 1 MG/ML IJ SOLN
0.5000 mg | Freq: Once | INTRAMUSCULAR | Status: AC
  Administered 2023-02-12: 0.5 mg via INTRAVENOUS
  Filled 2023-02-12: qty 1

## 2023-02-12 MED ORDER — KETOROLAC TROMETHAMINE 15 MG/ML IJ SOLN
15.0000 mg | Freq: Once | INTRAMUSCULAR | Status: AC
  Administered 2023-02-12: 15 mg via INTRAVENOUS
  Filled 2023-02-12: qty 1

## 2023-02-12 MED ORDER — SODIUM CHLORIDE 0.9 % IV BOLUS
1000.0000 mL | Freq: Once | INTRAVENOUS | Status: AC
  Administered 2023-02-12: 1000 mL via INTRAVENOUS

## 2023-02-12 MED ORDER — ONDANSETRON 4 MG PO TBDP
4.0000 mg | ORAL_TABLET | Freq: Three times a day (TID) | ORAL | 0 refills | Status: AC | PRN
Start: 2023-02-12 — End: ?

## 2023-02-12 MED ORDER — DICYCLOMINE HCL 20 MG PO TABS: 20.0000 mg | ORAL_TABLET | Freq: Two times a day (BID) | ORAL | 0 refills | Status: AC

## 2023-02-12 NOTE — ED Triage Notes (Signed)
Patient c/o RLQ pain and anorexia since Sunday and vomiting that started today. Denies urinary symptoms. Patient reports that UC recommended she come here for CT scan. 8/10 constant pain, nothing seems to make it worse or better. Still has appendix. A&Ox4 ambulated to bed.

## 2023-02-12 NOTE — ED Notes (Signed)
Report received from [facility] RN. Assumed care of pt at this time.

## 2023-02-12 NOTE — ED Provider Notes (Signed)
Care assumed from previous provider at shift change.  See note for full HPI  In summation 77 year old here for evaluation of nausea, vomiting right lower quadrant pain.  Plan on follow-up on CT scan disposition Physical Exam  BP (!) 158/73   Pulse 73   Temp 98.5 F (36.9 C) (Oral)   Resp 12   Ht 4\' 10"  (1.473 m)   Wt 76.2 kg   SpO2 100%   BMI 35.11 kg/m   Physical Exam Vitals and nursing note reviewed.  Constitutional:      General: She is not in acute distress.    Appearance: She is well-developed. She is not ill-appearing.  HENT:     Head: Atraumatic.  Eyes:     Pupils: Pupils are equal, round, and reactive to light.  Cardiovascular:     Rate and Rhythm: Normal rate.  Pulmonary:     Effort: No respiratory distress.  Abdominal:     General: Bowel sounds are normal. There is no distension.     Palpations: Abdomen is soft.     Tenderness: There is generalized abdominal tenderness.     Comments: Soft, generalized tenderness  Musculoskeletal:        General: Normal range of motion.     Cervical back: Normal range of motion.  Skin:    General: Skin is warm and dry.  Neurological:     General: No focal deficit present.     Mental Status: She is alert.  Psychiatric:        Mood and Affect: Mood normal.    Procedures  Procedures Labs Reviewed  COMPREHENSIVE METABOLIC PANEL - Abnormal; Notable for the following components:      Result Value   Sodium 133 (*)    Glucose, Bld 112 (*)    All other components within normal limits  CBC WITH DIFFERENTIAL/PLATELET - Abnormal; Notable for the following components:   RBC 5.39 (*)    HCT 46.4 (*)    Lymphs Abs 0.6 (*)    All other components within normal limits  URINALYSIS, ROUTINE W REFLEX MICROSCOPIC - Abnormal; Notable for the following components:   APPearance HAZY (*)    Hgb urine dipstick MODERATE (*)    Ketones, ur 5 (*)    Protein, ur >=300 (*)    Bacteria, UA FEW (*)    All other components within normal limits   LIPASE, BLOOD   CT ABDOMEN PELVIS W CONTRAST  Result Date: 02/12/2023 CLINICAL DATA:  Right lower quadrant pain EXAM: CT ABDOMEN AND PELVIS WITH CONTRAST TECHNIQUE: Multidetector CT imaging of the abdomen and pelvis was performed using the standard protocol following bolus administration of intravenous contrast. RADIATION DOSE REDUCTION: This exam was performed according to the departmental dose-optimization program which includes automated exposure control, adjustment of the mA and/or kV according to patient size and/or use of iterative reconstruction technique. CONTRAST:  75mL OMNIPAQUE IOHEXOL 350 MG/ML SOLN COMPARISON:  None Available. FINDINGS: Lower chest: No acute abnormality.  Small hiatal hernia. Hepatobiliary: No focal hepatic abnormality. Gallbladder unremarkable. Pancreas: No focal abnormality or ductal dilatation. Spleen: No focal abnormality.  Normal size. Adrenals/Urinary Tract: Right renal parapelvic cysts. No follow-up imaging recommended. No hydronephrosis. No suspicious renal or adrenal abnormality. Urinary bladder unremarkable. Stomach/Bowel: Normal appendix. Scattered colonic diverticulosis. No active diverticulitis. Stomach and small bowel decompressed. No bowel obstruction. Vascular/Lymphatic: No evidence of aneurysm or adenopathy. Reproductive: Uterus and adnexa unremarkable.  No mass. Other: No free fluid or free air. Musculoskeletal: No acute bony abnormality.  IMPRESSION: No acute findings in the abdomen or pelvis. Colonic diverticulosis. Normal appendix. Small hiatal hernia. Electronically Signed   By: Charlett Nose M.D.   On: 02/12/2023 19:34    ED Course / MDM   Clinical Course as of 02/12/23 2242  Tue Feb 12, 2023  1314 Patient reassessed, abdominal pain was resolved, feeling much better at the moment, patient is no longer feeling nausea. Awaiting CT scan and UA results. [JB]  1525 RLQ pain, N,V since Sunday. Getting CT scan. FU on results and dispo. [BH]  2143 Discussed  admission. Patient declined with nursing and family in room. She wants to go home [BH]    Clinical Course User Index [BH] Tomie Spizzirri A, PA-C [JB] Barrett, Horald Chestnut, PA-C   Care assumed from previous provider at shift change.  See note for full HPI  In summation 77 year old here for evaluation of nausea, vomiting right lower quadrant pain.  Plan on follow-up on CT scan disposition  Labs and imaging personally viewed and interpreted: CBC without leukocytosis, Hgb baseline UA with blood, no infection Lipase 31 CMP sodium 133 EKG without ischemic changes CT AP wo acute findings  Patient reassessed.  Her symptoms had improved.  Will attempt a p.o. challenge.  Nursing stated that patient had passed her p.o. challenge.  Will discharge patient  Upon discharging patient patient began with multiple episodes of NBNB emesis and diffuse abdominal pain. Will administer more meds and reassess.  When reassessing patient I discussed admission for intractable pain and vomiting.  Patient states she does not want to be admitted at this time.  Discussed risk versus benefit.  She states she wants to go home.  Continues to decline admission.  Patient reassessed. Still vomiting despite additional antiemetics. Again, recommend admission which patient declined.  Patient reassessed.  She continues to decline admission for persistent nausea and vomiting.  Patient will be leaving AGAINST MEDICAL ADVICE.  We discussed the nature and purpose, risks and benefits, as well as, the alternatives of treatment. Time was given to allow the opportunity to ask questions and consider their options, and after the discussion, the patient decided to refuse the offerred treatment. The patient was informed that refusal could lead to, but was not limited to, death, permanent disability, or severe pain. If present, I asked the relatives or significant others to dissuade them without success. Prior to refusing, I determined that  the patient had the capacity to make their decision and understood the consequences of that decision. After refusal, I made every reasonable opportunity to treat them to the best of my ability.  The patient was notified that they may return to the emergency department at any time for further treatment.      Medical Decision Making Amount and/or Complexity of Data Reviewed External Data Reviewed: labs, radiology, ECG and notes. Labs: ordered. Decision-making details documented in ED Course. Radiology: ordered and independent interpretation performed. Decision-making details documented in ED Course. ECG/medicine tests: ordered and independent interpretation performed. Decision-making details documented in ED Course.  Risk OTC drugs. Prescription drug management. Parenteral controlled substances. Decision regarding hospitalization. Diagnosis or treatment significantly limited by social determinants of health.       Janautica Netzley A, PA-C 02/12/23 2243    Melene Plan, DO 02/12/23 2246

## 2023-02-12 NOTE — ED Notes (Signed)
Patient is being discharged from the Urgent Care and sent to the Emergency Department via POV . Per Mardella Layman, MD, patient is in need of higher level of care due to vomiting. Patient is aware and verbalizes understanding of plan of care.  Vitals:   02/12/23 1014  BP: (!) 184/105  Pulse: 91  Resp: 17  Temp: 98.6 F (37 C)  SpO2: 98%

## 2023-02-12 NOTE — ED Notes (Signed)
Upon discharging pt, pt stood to get dressed and vomited. Daughter reports she vomited everything she just ate. PA made aware. Will administer additional nausea meds and re-assess per PA.

## 2023-02-12 NOTE — ED Notes (Signed)
Pt vomited again after the zofran. Unable to tolerate gingerale. Pt then given water and unable to tolerate as well. However, although pleasant, pt adamant about wanting to go home. Daughter at bedside. Provider aware. Zofran sent to pharmacy, but will leave AMA.

## 2023-02-12 NOTE — Discharge Instructions (Addendum)
It was a pleasure taking care of you here in the emergency department today.  As we discussed in the room your urine did show blood however no infection.  Your CT scan was reassuring without any obvious infection or kidney stones.  I have written for you for a few medications to help with your symptoms Zofran-this is a nausea medication take as needed Bentyl this is a medication for abdominal spasms.  We discussed admission given your persistent vomiting which you declined.  Please return if you still have persistent vomiting

## 2023-02-12 NOTE — ED Notes (Signed)
Pt placed on bedpan

## 2023-02-12 NOTE — ED Provider Notes (Signed)
[place] EMERGENCY DEPARTMENT AT [facility] Provider Note   CSN: [id number] Arrival date & time: 02/12/23  1126     History  No chief complaint on file.   Kerry [name] is a 77 y.o. female with past medical history of gout, hypothyroidism, liver disease, GERD, diverticulitis, hypertension, coronary artery disease, COPD presenting to the emergency room today with approximately 3 days of nausea vomiting and abdominal pain.  Patient reports right-sided flank pain that radiates to right lower quadrant.  Patient has had decreased appetite.  Patient was seen at urgent care 2-day prior to coming to emergency room for the same thing, they recommended she come here patient had swelling of blood in urine at that time. Provider was concerned for possible kidney stone. No chills, fever, shortness of breath, diarrhea, dysuria.  HPI     Home Medications Prior to Admission medications   Medication Sig Start Date End Date Taking? Authorizing Provider  aspirin 325 MG tablet Take 325 mg by mouth daily as needed for pain.    [provider]  rosuvastatin (CRESTOR) 5 MG tablet Take 5 mg by mouth at bedtime. 12/07/22   [provider]      Allergies    Chocolate, Strawberry extract, and Other    Review of Systems   Review of Systems  Gastrointestinal:  Positive for abdominal pain, nausea and vomiting.    Physical Exam Updated Vital Signs There were no vitals taken for this visit. Physical Exam Vitals and nursing note reviewed.  Constitutional:      General: She is not in acute distress.    Appearance: She is not ill-appearing, toxic-appearing or diaphoretic.  HENT:     Head: Normocephalic and atraumatic.  Eyes:     General: No scleral icterus.    Conjunctiva/sclera: Conjunctivae normal.  Cardiovascular:     Rate and Rhythm: Normal rate and regular rhythm.     Pulses: Normal pulses.     Heart sounds: Normal heart sounds. No murmur heard. Pulmonary:      Effort: Pulmonary effort is normal. No respiratory distress.     Breath sounds: Normal breath sounds. No wheezing or rales.  Chest:     Chest wall: No tenderness.  Abdominal:     General: Abdomen is flat. Bowel sounds are normal. There is no distension.     Palpations: Abdomen is soft. There is no mass.     Tenderness: There is abdominal tenderness. There is no right CVA tenderness or left CVA tenderness.     Comments: RLQ pain to palpation   Musculoskeletal:     Right lower leg: No edema.     Left lower leg: No edema.  Skin:    General: Skin is warm and dry.     Capillary Refill: Capillary refill takes less than 2 seconds.     Findings: No lesion or rash.  Neurological:     General: No focal deficit present.     Mental Status: She is alert and oriented to person, place, and time. Mental status is at baseline.     Cranial Nerves: No cranial nerve deficit.     Sensory: No sensory deficit.     Motor: No weakness.     ED Results / Procedures / Treatments   Labs (all labs ordered are listed, but only abnormal results are displayed) Labs Reviewed - No data to display  EKG None  Radiology No results found.  Procedures Procedures    Medications Ordered in ED Medications -  No data to display  ED Course/ Medical Decision Making/ A&P Clinical Course as of 02/12/23 1608  Tue Feb 12, 2023  1314 Patient reassessed, abdominal pain was resolved, feeling much better at the moment, patient is no longer feeling nausea. Awaiting CT scan and UA results. [JB]  1525 RLQ pain, N,V since Sunday. Getting CT scan. FU on results and dispo. [BH]    Clinical Course User Index [BH] Henderly, Britni A, PA-C [JB] Markale Birdsell, Horald Chestnut, PA-C                                 Medical Decision Making Amount and/or Complexity of Data Reviewed Labs: ordered. Radiology: ordered.  Risk Prescription drug management.   Kerry [name] 77 y.o. presented today for abd pain. Working DDx includes, but not  limited to, gastroenteritis, colitis, SBO, appendicitis, cholecystitis, hepatobiliary pathology, gastritis, PUD, ACS, dissection, pancreatitis, nephrolithiasis, AAA, UTI, pyelonephritis, ruptured ectopic pregnancy, PID, ovarian  R/o DDx: These are considered less likely than current impression due to history of present illness, physical exam, labs/imaging findings.  Review of prior external notes: 02/12/2023 OV  Pmhx: CAD, COPD, kidney stones, GERD, HTN, History of PE  Unique Tests and My Interpretation:  CBC with differential: No elevated white blood cell count, hemoglobin 14.9 CMP: Na 133, K wnl, otherwise unremarkable  Lipase: Unremarkable  UA: Nitrites negative, leukocytes negative, patient has moderate amount of hemoglobin in urine   EKG: Rate, rhythm, axis, intervals all examined: Sinus rhythm  Imaging:  CT Abd/Pelvis with contrast: evaluate for structural/surgical etiology of patients' severe abdominal pain.    Problem List / ED Course / Critical interventions / Medication management  Patient coming to emergency room with several days of right lower quadrant and right flank pain.  Patient has had associated decreased appetite, nausea and vomiting.  Patient was seen in urgent care and given Zofran, this had some improvement with her nausea.  However continued to have pain, ordering EKG, basic labs, urinalysis and CT to evaluate. CT PENDING  I ordered medication including Morphine, Zofran, NS  for Abd pain, NV  Reevaluation of the patient after these medicines showed that the patient improved Patients vitals assessed. Upon arrival patient is hemodynamically stable.  I have reviewed the patients home medicines and have made adjustments as needed    Consult: None  Plan: Patient passed off to oncoming ED provider Britini        Final Clinical Impression(s) / ED Diagnoses Final diagnoses:  None    Rx / DC Orders ED Discharge Orders     None         Smitty Knudsen, PA-C 02/12/23 1609    Durwin Glaze, MD 02/12/23 1641

## 2023-02-12 NOTE — ED Provider Notes (Addendum)
[facility] CARE CENTER   [id number] 02/12/23 Arrival Time: 1004  ASSESSMENT & PLAN:  1. Right flank pain   2. Nausea and vomiting, unspecified vomiting type   3. Elevated blood pressure reading with diagnosis of hypertension    Labs Reviewed  POCT URINALYSIS DIP (MANUAL ENTRY) - Abnormal; Notable for the following components:      Result Value   Clarity, UA cloudy (*)    Spec Grav, UA >=1.030 (*)    Blood, UA moderate (*)    Protein Ur, POC >=300 (*)    All other components within normal limits  POCT FASTING CBG KUC MANUAL ENTRY - Abnormal; Notable for the following components:   POCT Glucose (KUC) 115 (*)    All other components within normal limits   Limited diagnostic capability here. Cannot r/o kidney stone vs other intra-abdominal process. To ED via POV for further evaluation; stable upon d/c.  Meds ordered this encounter  Medications   ondansetron (ZOFRAN-ODT) disintegrating tablet 4 mg     Discharge Instructions       Meds ordered this encounter  Medications   ondansetron (ZOFRAN-ODT) disintegrating tablet 4 mg       Follow-up Information     Go to  [facility] Emergency Department at [facility].   Specialty: Emergency Medicine Contact information: [address] [place] [address] [phone]               Reviewed expectations re: course of current medical issues. Questions answered. Outlined signs and symptoms indicating need for more acute intervention. Patient verbalized understanding. After Visit Summary given.   SUBJECTIVE: History from: patient. Kerry [name] is a 77 y.o. female who presents with complaint of non-radiating R flank pain; abrupt onset; first noted 2-3 days ago; intermittent; has eased a bit today. With n/v currently. Denies specific abd pain. Denies fever/chills. With decreased appetite and PO intake. Does reports some urinary frequency. Ambulatory without difficulty.  No LMP recorded.  Patient is postmenopausal. Past Surgical History:  Procedure Laterality Date   DILATION AND CURETTAGE OF UTERUS       OBJECTIVE:  Vitals:   02/12/23 1014  BP: (!) 184/105  Pulse: 91  Resp: 17  Temp: 98.6 F (37 C)  TempSrc: Oral  SpO2: 98%    General appearance: alert, oriented, no acute distress but appears uncomfortable HEENT: [place]; AT; oropharynx moist Lungs: unlabored respirations Abdomen: soft; without distention; with R flank pain reported; denies abd pain to palpation; without guarding or rebound TTP Back: FROM at waist Extremities: without LE edema; symmetrical; without gross deformities Skin: warm and dry Neurologic: normal gait Psychological: alert and cooperative; normal mood and affect  Labs: Results for orders placed or performed during the hospital encounter of 02/12/23  POC urinalysis dipstick  Result Value Ref Range   Color, UA yellow yellow   Clarity, UA cloudy (A) clear   Glucose, UA negative negative mg/dL   Bilirubin, UA negative negative   Ketones, POC UA negative negative mg/dL   Spec Grav, UA >=2.130 (A) 1.010 - 1.025   Blood, UA moderate (A) negative   pH, UA 7.0 5.0 - 8.0   Protein Ur, POC >=300 (A) negative mg/dL   Urobilinogen, UA 0.2 0.2 or 1.0 E.U./dL   Nitrite, UA Negative Negative   Leukocytes, UA Negative Negative  POC CBG monitoring  Result Value Ref Range   POCT Glucose (KUC) 115 (A) 70 - 99 mg/dL   Labs Reviewed  POCT URINALYSIS DIP (MANUAL  ENTRY) - Abnormal; Notable for the following components:      Result Value   Clarity, UA cloudy (*)    Spec Grav, UA >=1.030 (*)    Blood, UA moderate (*)    Protein Ur, POC >=300 (*)    All other components within normal limits  POCT FASTING CBG KUC MANUAL ENTRY - Abnormal; Notable for the following components:   POCT Glucose (KUC) 115 (*)    All other components within normal limits    Allergies  Allergen Reactions   Chocolate     If processed with nuts   Strawberry Extract  Swelling   Other Itching and Rash    ALL NUTS ALLERGY                                               Past Medical History:  Diagnosis Date   Acid reflux    Anxiety disorder    Arrhythmia    Arthritis    Asthma    CAD (coronary artery disease)    Cancer (HCC)    COPD (chronic obstructive pulmonary disease) (HCC)    Depression    Diabetes (HCC)    Diverticulitis    Fibromyalgia    GERD (gastroesophageal reflux disease)    Gout    Heart disease    High cholesterol    HTN (hypertension)    Hyperthyroidism    Hypothyroidism    Kidney stone    Kidney stones    Liver disease    Osteoporosis    Pulmonary embolism (HCC)    Stroke (HCC)    Tuberculosis     Social History   Socioeconomic History   Marital status: Single    Spouse name: Not on file   Number of children: Not on file   Years of education: Not on file   Highest education level: Not on file  Occupational History   Not on file  Tobacco Use   Smoking status: Never   Smokeless tobacco: Not on file  Substance and Sexual Activity   Alcohol use: Yes    Alcohol/week: 7.0 standard drinks of alcohol    Types: 7 Cans of beer per week   Drug use: No   Sexual activity: Not on file  Other Topics Concern   Not on file  Social History Narrative   Not on file   Social Determinants of Health   Financial Resource Strain: Not on file  Food Insecurity: Not on file  Transportation Needs: Not on file  Physical Activity: Not on file  Stress: Not on file  Social Connections: Not on file  Intimate Partner Violence: Not on file    History reviewed. No pertinent family history.   Mardella Layman, MD 02/12/23 1120    Mardella Layman, MD 02/12/23 1126

## 2023-02-12 NOTE — ED Triage Notes (Signed)
Pt c/o of right side flank pain and vomiting that started Sunday. Pt denies any urinary symptoms

## 2023-02-12 NOTE — Discharge Instructions (Addendum)
Meds ordered this encounter  Medications   ondansetron (ZOFRAN-ODT) disintegrating tablet 4 mg   Your blood pressure was noted to be elevated during your visit today. If you are currently taking medication for high blood pressure, please ensure you are taking this as directed. If you do not have a history of high blood pressure and your blood pressure remains persistently elevated, you may need to begin taking a medication at some point. You may return here within the next few days to recheck if unable to see your primary care provider or if you do not have a one.  BP (!) 184/105 (BP Location: Right Arm)   Pulse 91   Temp 98.6 F (37 C) (Oral)   Resp 17   SpO2 98%   BP Readings from Last 3 Encounters:  02/12/23 (!) 184/105  11/11/15 149/81  12/31/14 149/76

## 2023-02-14 ENCOUNTER — Other Ambulatory Visit: Payer: Self-pay

## 2023-02-14 ENCOUNTER — Emergency Department (HOSPITAL_COMMUNITY)
Admission: EM | Admit: 2023-02-14 | Discharge: 2023-02-14 | Disposition: A | Discharge status: Home / Self Care | Payer: 59 | Attending: Emergency Medicine | Admitting: Emergency Medicine

## 2023-02-14 ENCOUNTER — Emergency Department (HOSPITAL_COMMUNITY): Payer: 59

## 2023-02-14 DIAGNOSIS — Z79899 Other long term (current) drug therapy: Secondary | ICD-10-CM | POA: Diagnosis not present

## 2023-02-14 DIAGNOSIS — E119 Type 2 diabetes mellitus without complications: Secondary | ICD-10-CM | POA: Insufficient documentation

## 2023-02-14 DIAGNOSIS — Z7982 Long term (current) use of aspirin: Secondary | ICD-10-CM | POA: Diagnosis not present

## 2023-02-14 DIAGNOSIS — R1084 Generalized abdominal pain: Secondary | ICD-10-CM

## 2023-02-14 DIAGNOSIS — E039 Hypothyroidism, unspecified: Secondary | ICD-10-CM | POA: Insufficient documentation

## 2023-02-14 DIAGNOSIS — I2510 Atherosclerotic heart disease of native coronary artery without angina pectoris: Secondary | ICD-10-CM | POA: Insufficient documentation

## 2023-02-14 DIAGNOSIS — R1031 Right lower quadrant pain: Secondary | ICD-10-CM | POA: Diagnosis present

## 2023-02-14 DIAGNOSIS — I10 Essential (primary) hypertension: Secondary | ICD-10-CM | POA: Insufficient documentation

## 2023-02-14 DIAGNOSIS — J449 Chronic obstructive pulmonary disease, unspecified: Secondary | ICD-10-CM | POA: Diagnosis not present

## 2023-02-14 LAB — URINALYSIS, W/ REFLEX TO CULTURE (INFECTION SUSPECTED)
Bacteria, UA: NONE SEEN
Bilirubin Urine: NEGATIVE
Glucose, UA: NEGATIVE mg/dL
Ketones, ur: 5 mg/dL — AB
Leukocytes,Ua: NEGATIVE
Nitrite: NEGATIVE
Protein, ur: NEGATIVE mg/dL
Specific Gravity, Urine: >1.046 — ABNORMAL HIGH (ref 1.005–1.030)
pH: 5 (ref 5.0–8.0)

## 2023-02-14 LAB — CBC WITH DIFFERENTIAL/PLATELET
Abs Immature Granulocytes: 0.02 10*3/uL (ref 0.00–0.07)
Basophils Absolute: 0 10*3/uL (ref 0.0–0.1)
Basophils Relative: 1 %
Eosinophils Absolute: 0.1 10*3/uL (ref 0.0–0.5)
Eosinophils Relative: 3 %
HCT: 43.1 % (ref 36.0–46.0)
Hemoglobin: 13.7 g/dL (ref 12.0–15.0)
Immature Granulocytes: 0 %
Lymphocytes Relative: 19 %
Lymphs Abs: 0.9 10*3/uL (ref 0.7–4.0)
MCH: 27.4 pg (ref 26.0–34.0)
MCHC: 31.8 g/dL (ref 30.0–36.0)
MCV: 86.2 fL (ref 80.0–100.0)
Monocytes Absolute: 0.3 10*3/uL (ref 0.1–1.0)
Monocytes Relative: 7 %
Neutro Abs: 3.4 10*3/uL (ref 1.7–7.7)
Neutrophils Relative %: 70 %
Platelets: 196 10*3/uL (ref 150–400)
RBC: 5 MIL/uL (ref 3.87–5.11)
RDW: 14 % (ref 11.5–15.5)
WBC: 4.8 10*3/uL (ref 4.0–10.5)
nRBC: 0 % (ref 0.0–0.2)

## 2023-02-14 LAB — COMPREHENSIVE METABOLIC PANEL
ALT: 17 U/L (ref 0–44)
AST: 33 U/L (ref 15–41)
Albumin: 3.4 g/dL — ABNORMAL LOW (ref 3.5–5.0)
Alkaline Phosphatase: 67 U/L (ref 38–126)
Anion gap: 11 (ref 5–15)
BUN: 15 mg/dL (ref 8–23)
CO2: 23 mmol/L (ref 22–32)
Calcium: 9 mg/dL (ref 8.9–10.3)
Chloride: 101 mmol/L (ref 98–111)
Creatinine, Ser: 1.07 mg/dL — ABNORMAL HIGH (ref 0.44–1.00)
GFR, Estimated: 54 mL/min — ABNORMAL LOW (ref >60)
Glucose, Bld: 85 mg/dL (ref 70–99)
Potassium: 3.9 mmol/L (ref 3.5–5.1)
Sodium: 135 mmol/L (ref 135–145)
Total Bilirubin: 0.5 mg/dL (ref 0.3–1.2)
Total Protein: 7.4 g/dL (ref 6.5–8.1)

## 2023-02-14 LAB — LIPASE, BLOOD: Lipase: 31 U/L (ref 11–51)

## 2023-02-14 MED ORDER — IOHEXOL 350 MG/ML SOLN
75.0000 mL | Freq: Once | INTRAVENOUS | Status: AC | PRN
  Administered 2023-02-14: 75 mL via INTRAVENOUS

## 2023-02-14 MED ORDER — CEPHALEXIN 500 MG PO CAPS: 500.0000 mg | ORAL_CAPSULE | Freq: Two times a day (BID) | ORAL | 0 refills | Status: AC

## 2023-02-14 MED ORDER — ONDANSETRON HCL 4 MG/2ML IJ SOLN
4.0000 mg | Freq: Once | INTRAMUSCULAR | Status: AC
  Administered 2023-02-14: 4 mg via INTRAVENOUS
  Filled 2023-02-14: qty 2

## 2023-02-14 MED ORDER — NAPROXEN 500 MG PO TABS: 500.0000 mg | ORAL_TABLET | Freq: Two times a day (BID) | ORAL | 0 refills | Status: AC

## 2023-02-14 MED ORDER — MORPHINE SULFATE (PF) 4 MG/ML IV SOLN
4.0000 mg | Freq: Once | INTRAVENOUS | Status: AC
  Administered 2023-02-14: 4 mg via INTRAVENOUS
  Filled 2023-02-14: qty 1

## 2023-02-14 NOTE — ED Notes (Signed)
Pt reports she has RLQ pain that started Sunday and has gotten worse over time.

## 2023-02-14 NOTE — ED Triage Notes (Signed)
Pt to ED via [facility] from PCP. Pt c/o lower right sided abd pain. Pt was here last night for same and was [facility] after feeling better after fluids and meds. Pt stated pain came back later that night after being DCed. Pt denies N/V/D. Pt does c/o pain (dull pain) while urinating that is new today.   EMS: 140/70 84 HR 96% RA

## 2023-02-14 NOTE — ED Provider Notes (Signed)
[place] EMERGENCY DEPARTMENT AT [facility] Provider Note   CSN: [id number] Arrival date & time: 02/14/23  1531     History  Chief Complaint  Patient presents with   Abdominal Pain    Kerry [name] is a 77 y.o. female.   Abdominal Pain  77 year old female with past medical history of CAD, COPD, diabetes, hypertension, hyperlipidemia, hypothyroidism presenting for evaluation of abdominal pain.  Patient was seen in the emergency department on 02/12/2023 for similar symptoms.  She says that interventions in the emergency department were helpful, but then she went home and her pain began again.  Patient reports that her right lower quadrant abdominal pain first began 5 days ago.  She denies any preceding trauma, twisting, bending, or lifting.  No falls.  She describes sharp pain in her right lower quadrant.  Lying on her left side makes the pain on the right side of her abdomen worse.  She has not identified any relieving factors.  Denies any accompanying fevers.  Associated nausea and vomiting are mention.  Patient states that she has approximately 3 episodes of emesis per day.  No blood in the emesis.  In addition, patient reports pain with urination.  She denies ever having urinary tract infections in the past.  Reports her last bowel movement was 6 days ago.  Says that she is still passing gas.     Home Medications Prior to Admission medications   Medication Sig Start Date End Date Taking? Authorizing Provider  aspirin 325 MG tablet Take 325 mg by mouth daily.   Yes [provider]  cephALEXin (KEFLEX) 500 MG capsule Take 1 capsule (500 mg total) by mouth 2 (two) times daily for 7 days. 02/14/23 02/21/23 Yes Lyman Speller, MD  dicyclomine (BENTYL) 20 MG tablet Take 1 tablet (20 mg total) by mouth 2 (two) times daily. 02/12/23  Yes Henderly, Britni A, PA-C  naproxen (NAPROSYN) 500 MG tablet Take 1 tablet (500 mg total) by mouth 2 (two) times daily for 7 days.  02/14/23 02/21/23 Yes Lyman Speller, MD  ondansetron (ZOFRAN-ODT) 4 MG disintegrating tablet Take 1 tablet (4 mg total) by mouth every 8 (eight) hours as needed. 02/12/23  Yes Henderly, Britni A, PA-C  rosuvastatin (CRESTOR) 5 MG tablet Take 5 mg by mouth at bedtime. 12/07/22  Yes [provider]      Allergies    Chocolate, Strawberry extract, and Other    Review of Systems   Review of Systems  Gastrointestinal:  Positive for abdominal pain.    Physical Exam Updated Vital Signs BP (!) 163/75   Pulse 71   Temp 98.9 F (37.2 C) (Oral)   Resp 11   SpO2 98%  Physical Exam Constitutional:      General: She is not in acute distress.    Appearance: She is not ill-appearing.  HENT:     Head: Normocephalic and atraumatic.     Mouth/Throat:     Mouth: Mucous membranes are moist.  Eyes:     Extraocular Movements: Extraocular movements intact.  Cardiovascular:     Rate and Rhythm: Normal rate and regular rhythm.     Heart sounds: Normal heart sounds.  Pulmonary:     Effort: Pulmonary effort is normal. No respiratory distress.     Breath sounds: No wheezing.  Abdominal:     General: Abdomen is flat.     Palpations: Abdomen is soft.     Tenderness: There is abdominal tenderness in the right lower quadrant.  There is no right CVA tenderness, left CVA tenderness, guarding or rebound. Negative signs include Murphy's sign and McBurney's sign.     Comments: Abdomen is very soft, not peritonitic.  She does not wince to deep palpation, but reports tenderness in the right lower quadrant  Skin:    General: Skin is warm and dry.     Findings: No rash.  Neurological:     General: No focal deficit present.     Mental Status: She is alert.     Cranial Nerves: No cranial nerve deficit.     Motor: No weakness.     ED Results / Procedures / Treatments   Labs (all labs ordered are listed, but only abnormal results are displayed) Labs Reviewed  COMPREHENSIVE METABOLIC PANEL -  Abnormal; Notable for the following components:      Result Value   Creatinine, Ser 1.07 (*)    Albumin 3.4 (*)    GFR, Estimated 54 (*)    All other components within normal limits  URINALYSIS, W/ REFLEX TO CULTURE (INFECTION SUSPECTED) - Abnormal; Notable for the following components:   Specific Gravity, Urine >1.046 (*)    Hgb urine dipstick SMALL (*)    Ketones, ur 5 (*)    All other components within normal limits  URINE CULTURE  CBC WITH DIFFERENTIAL/PLATELET  LIPASE, BLOOD    EKG None  Radiology CT ABDOMEN PELVIS W CONTRAST  Result Date: 02/14/2023 CLINICAL DATA:  Right lower quadrant abdominal pain EXAM: CT ABDOMEN AND PELVIS WITH CONTRAST TECHNIQUE: Multidetector CT imaging of the abdomen and pelvis was performed using the standard protocol following bolus administration of intravenous contrast. RADIATION DOSE REDUCTION: This exam was performed according to the departmental dose-optimization program which includes automated exposure control, adjustment of the mA and/or kV according to patient size and/or use of iterative reconstruction technique. CONTRAST:  75mL OMNIPAQUE IOHEXOL 350 MG/ML SOLN COMPARISON:  CT abdomen pelvis 02/12/2023 FINDINGS: Lower chest: No acute abnormality. Hepatobiliary: Unremarkable liver. Normal gallbladder. No biliary dilation. Pancreas: Unremarkable. Spleen: Unremarkable. Adrenals/Urinary Tract: Normal adrenal glands. No urinary calculi or hydronephrosis. Bladder is unremarkable. Stomach/Bowel: Normal caliber large and small bowel. Colonic diverticulosis without diverticulitis. No bowel wall thickening. The appendix is normal.Stomach is within normal limits. Small hiatal hernia. Vascular/Lymphatic: No significant vascular findings are present. No enlarged abdominal or pelvic lymph nodes. Reproductive: Unremarkable. Other: No free intraperitoneal fluid or air. Musculoskeletal: No acute fracture. IMPRESSION: 1. No acute abdominopelvic process. Normal  appendix. Electronically Signed   By: Minerva Fester M.D.   On: 02/14/2023 18:54    Procedures Procedures    Medications Ordered in ED Medications  ondansetron (ZOFRAN) injection 4 mg (4 mg Intravenous Given 02/14/23 1615)  morphine (PF) 4 MG/ML injection 4 mg (4 mg Intravenous Given 02/14/23 1615)  iohexol (OMNIPAQUE) 350 MG/ML injection 75 mL (75 mLs Intravenous Contrast Given 02/14/23 1757)    ED Course/ Medical Decision Making/ A&P Clinical Course as of 02/14/23 2250  Thu Feb 14, 2023  2043 Leukocytes,Ua: NEGATIVE [KM]    Clinical Course User Index [KM] Lyman Speller, MD                                 Medical Decision Making Amount and/or Complexity of Data Reviewed Labs: ordered. Decision-making details documented in ED Course. Radiology: ordered.  Risk Prescription drug management.   Bismah Weintraub is a 77 y.o. female who presents with right lower quadrant abdominal  pain for the last 5 days as per above.  I have reviewed the nursing documentation for past medical history, family history, and social history.  On exam patient is awake, alert, and hemodynamically stable.  Examination shows a benign abdomen, but patient continues to reiterate sharp pain in her right lower quadrant.  Differential Diagnoses:  Given elderly age and comorbidities, I considered life- threatening diagnoses including mesenteric ischemia, obstruction, diverticular disease, biliary disease, ACS, AAA, Aortic Dissection, perforation, pancreatitis, and GI bleed/hematoma.   Given elderly age as well as lack of fever, anorexia, rebound tenderness or guarding, and Rovsing sign, doubt appendicitis. Lower suspicion for pancreatitis, as the patient has no history of alcohol abuse or gallstones, does not have epigastric pain radiating to the back, and has no N/V; however will obtain lipase to further evaluate. Biliary colic, cholecystitis, or cholangitis seem unlikely given pain not located in RUQ, no  jaundice, no fevers/chills, and negative Murphy's sign.  The patient has no flank/groin pain, CVA tenderness, hematuria, or fever to suggest nephrolithiasis or pyelonephritis. There is no palpable hernia on exam. I do not think this is perforated viscus, as the patient has no rigidity/peritonitis, distress, fever, or hemodynamic instability. No rash or dermatomal distribution of symptoms to suggest shingles.  Felt unlikely to be GU pathology such as ovarian torsion, TOA, STI, PID.   Alternative etiologies considered include mesenteric ischemia given patient reported severe pain out of proportion to examination.  However, does not have a history of atrial fibrillation, no bloody stool.  Bowel obstruction certainly is possible given that she has not had a bowel movement in the last 6 days, however, she continues to pass gas.  Constipation is high on differential given her lack of bowel movement in recent days.   Workup: Labs notable for slight bump in creatinine to 1.07, but no other gross metabolic derangements.  White blood cell count is 4.8.  Lipase 31.  Urinalysis not consistent with infection. CTAP on my independent review as well as review by Radiology demonstrates no acute findings.  She does have some colonic diverticulosis, but no evidence of diverticulitis.  Normal-appearing appendix.  No evidence of colitis, SBO, abscess, perforation.  No evidence of mesenteric ischemia (now 2 consecutive CT scans without contrast without ischemic findings).  Re-Assessment: On reassessment patient remains HDS and overall well appearing.  Abdomen remains benign without peritonitic signs.  Patient ambulatory within the emergency department.  We discussed the above findings with patient in detail, the patient and her daughter are reassured.  Disposition: I believe that the patient is safe for discharge home with outpatient followup. We discussed following up with her primary care provider in the next 1 to 2 days.   She will be discharged home with a printed prescription for Keflex.  We had a shared decision-making conversation regarding treating for UTI given her pain with urination and abdominal pain, but unremarkable urinalysis.  She was given a printed prescription and can start this medication should she experience worsening burning pain with urination in the coming days.  We also discussed constipation management as patient has not had a bowel movement in 6 days and constipation certainly can contribute to abdominal pain.  I provided thorough ED return precautions. Patient understands and agrees with the plan.   The plan for this patient was discussed with my attending physician, Dr. Rush Landmark, who voiced agreement and who oversaw evaluation and treatment of this patient.    Note: Chief Executive Officer was used in [employer]  of this note   Final Clinical Impression(s) / ED Diagnoses Final diagnoses:  Generalized abdominal pain    Rx / DC Orders ED Discharge Orders          Ordered    cephALEXin (KEFLEX) 500 MG capsule  2 times daily        02/14/23 2103    naproxen (NAPROSYN) 500 MG tablet  2 times daily        02/14/23 2103              Lyman Speller, MD 02/14/23 2252    Tegeler, Canary Brim, MD 02/18/23 1059

## 2023-02-14 NOTE — Discharge Instructions (Signed)
Kerry [name]:  Thank you for allowing [place] to take care of you today.  We hope you begin feeling better soon. You were seen today for abdominal pain.  To-Do: Please follow-up with your primary doctor to schedule an appointment with a new primary care doctor within the next 2-3 days. You may begin taking naproxen for pain management in the coming days A prescription for an antibiotic, Keflex, was provided.  You may begin taking this should you have pain with urination or worsening abdominal pain in the coming days  Please return to the Emergency Department or call 911 if you experience chest pain, shortness of breath, severe pain, severe fever, altered mental status, or have any reason to think that you need emergency medical care.  Thank you again.  Hope you feel better soon.

## 2023-02-15 LAB — URINE CULTURE: Culture: NO GROWTH

## 2023-02-17 NOTE — Plan of Care (Signed)
[facility] Tonsillectomy/Adenoidectomy, Postoperative PEDS care plan entered in error.
